# Patient Record
Sex: Male | Born: 1975
Health system: Southern US, Community
[De-identification: ages and names within clinical notes are randomized; demographics above are authoritative.]

## PROBLEM LIST (undated history)

## (undated) DIAGNOSIS — E119 Type 2 diabetes mellitus without complications: Secondary | ICD-10-CM

## (undated) DIAGNOSIS — F1911 Other psychoactive substance abuse, in remission: Secondary | ICD-10-CM

## (undated) DIAGNOSIS — F1011 Alcohol abuse, in remission: Secondary | ICD-10-CM

## (undated) DIAGNOSIS — I10 Essential (primary) hypertension: Secondary | ICD-10-CM

## (undated) DIAGNOSIS — E785 Hyperlipidemia, unspecified: Secondary | ICD-10-CM

## (undated) DIAGNOSIS — G9340 Encephalopathy, unspecified: Secondary | ICD-10-CM

## (undated) DIAGNOSIS — F411 Generalized anxiety disorder: Secondary | ICD-10-CM

## (undated) DIAGNOSIS — F410 Panic disorder [episodic paroxysmal anxiety] without agoraphobia: Secondary | ICD-10-CM

## (undated) HISTORY — DX: Essential (primary) hypertension: I10

## (undated) HISTORY — PX: ANKLE SURGERY: SHX546

## (undated) HISTORY — DX: Type 2 diabetes mellitus without complications: E11.9

## (undated) HISTORY — DX: Generalized anxiety disorder: F41.1

## (undated) HISTORY — DX: Hyperlipidemia, unspecified: E78.5

## (undated) HISTORY — DX: Alcohol abuse, in remission: F10.11

## (undated) HISTORY — DX: Encephalopathy, unspecified: G93.40

## (undated) HISTORY — PX: SHOULDER ARTHROSCOPY: SHX128

## (undated) HISTORY — DX: Other psychoactive substance abuse, in remission: F19.11

## (undated) HISTORY — DX: Panic disorder (episodic paroxysmal anxiety): F41.0

## (undated) HISTORY — PX: ARTHROSCOPIC REPAIR ACL: SUR80

## (undated) HISTORY — PX: ROTATOR CUFF REPAIR: SHX139

---

## 2008-10-13 ENCOUNTER — Emergency Department (HOSPITAL_BASED_OUTPATIENT_CLINIC_OR_DEPARTMENT_OTHER): Admission: EM | Admit: 2008-10-13 | Discharge: 2008-10-13 | Payer: Self-pay | Admitting: Emergency Medicine

## 2009-09-12 ENCOUNTER — Emergency Department (HOSPITAL_COMMUNITY): Admission: EM | Admit: 2009-09-12 | Discharge: 2009-09-12 | Payer: Self-pay | Admitting: Family Medicine

## 2009-09-13 ENCOUNTER — Emergency Department (HOSPITAL_COMMUNITY): Admission: EM | Admit: 2009-09-13 | Discharge: 2009-09-13 | Payer: Self-pay | Admitting: Emergency Medicine

## 2009-09-17 ENCOUNTER — Emergency Department (HOSPITAL_COMMUNITY): Admission: EM | Admit: 2009-09-17 | Discharge: 2009-09-17 | Payer: Self-pay | Admitting: Emergency Medicine

## 2010-04-27 ENCOUNTER — Emergency Department (HOSPITAL_COMMUNITY)
Admission: EM | Admit: 2010-04-27 | Discharge: 2010-04-27 | Payer: Self-pay | Source: Home / Self Care | Admitting: Emergency Medicine

## 2010-04-28 ENCOUNTER — Ambulatory Visit (HOSPITAL_COMMUNITY)
Admission: RE | Admit: 2010-04-28 | Discharge: 2010-04-28 | Payer: Self-pay | Source: Home / Self Care | Attending: Emergency Medicine | Admitting: Emergency Medicine

## 2010-05-30 ENCOUNTER — Encounter: Payer: Self-pay | Admitting: Emergency Medicine

## 2010-07-27 LAB — COMPREHENSIVE METABOLIC PANEL
ALT: 55 U/L — ABNORMAL HIGH (ref 0–53)
Albumin: 3.9 g/dL (ref 3.5–5.2)
Alkaline Phosphatase: 94 U/L (ref 39–117)
Alkaline Phosphatase: 99 U/L (ref 39–117)
BUN: 11 mg/dL (ref 6–23)
CO2: 28 mEq/L (ref 19–32)
Calcium: 8.4 mg/dL (ref 8.4–10.5)
GFR calc non Af Amer: >60 mL/min (ref >60)
Glucose, Bld: 171 mg/dL — ABNORMAL HIGH (ref 70–99)
Potassium: 3 mEq/L — ABNORMAL LOW (ref 3.5–5.1)
Potassium: 3.6 mEq/L (ref 3.5–5.1)
Sodium: 133 mEq/L — ABNORMAL LOW (ref 135–145)
Total Protein: 7.4 g/dL (ref 6.0–8.3)

## 2010-07-27 LAB — ETHANOL
Alcohol, Ethyl (B): 249 mg/dL — ABNORMAL HIGH (ref 0–10)
Alcohol, Ethyl (B): 332 mg/dL — ABNORMAL HIGH (ref 0–10)

## 2010-07-27 LAB — DIFFERENTIAL
Basophils Relative: 1 % (ref 0–1)
Basophils Relative: 1 % (ref 0–1)
Eosinophils Absolute: 0.2 10*3/uL (ref 0.0–0.7)
Lymphocytes Relative: 31 % (ref 12–46)
Lymphs Abs: 2.2 10*3/uL (ref 0.7–4.0)
Monocytes Absolute: 0.5 10*3/uL (ref 0.1–1.0)
Monocytes Relative: 6 % (ref 3–12)
Monocytes Relative: 9 % (ref 3–12)
Neutro Abs: 4.4 10*3/uL (ref 1.7–7.7)
Neutrophils Relative %: 46 % (ref 43–77)

## 2010-07-27 LAB — POCT I-STAT, CHEM 8
Calcium, Ion: 0.98 mmol/L — ABNORMAL LOW (ref 1.12–1.32)
Glucose, Bld: 126 mg/dL — ABNORMAL HIGH (ref 70–99)
Hemoglobin: 18 g/dL — ABNORMAL HIGH (ref 13.0–17.0)
Potassium: 4 mEq/L (ref 3.5–5.1)
TCO2: 26 mmol/L (ref 0–100)

## 2010-07-27 LAB — RAPID URINE DRUG SCREEN, HOSP PERFORMED
Amphetamines: NOT DETECTED
Barbiturates: NOT DETECTED
Benzodiazepines: NOT DETECTED
Opiates: NOT DETECTED

## 2010-07-27 LAB — URINALYSIS, ROUTINE W REFLEX MICROSCOPIC
Glucose, UA: NEGATIVE mg/dL
Hgb urine dipstick: NEGATIVE
Specific Gravity, Urine: 1.017 (ref 1.005–1.030)
pH: 6 (ref 5.0–8.0)

## 2010-07-27 LAB — CBC
HCT: 48.2 % (ref 39.0–52.0)
MCHC: 35.5 g/dL (ref 30.0–36.0)
MCHC: 35.6 g/dL (ref 30.0–36.0)
Platelets: 261 10*3/uL (ref 150–400)
Platelets: 291 10*3/uL (ref 150–400)
RDW: 13.9 % (ref 11.5–15.5)

## 2010-07-27 LAB — LIPASE, BLOOD: Lipase: 22 U/L (ref 11–59)

## 2010-08-16 LAB — URINALYSIS, ROUTINE W REFLEX MICROSCOPIC
Bilirubin Urine: NEGATIVE
Glucose, UA: NEGATIVE mg/dL
Hgb urine dipstick: NEGATIVE
Ketones, ur: NEGATIVE mg/dL
Nitrite: NEGATIVE
Protein, ur: NEGATIVE mg/dL
Specific Gravity, Urine: 1.008 (ref 1.005–1.030)
Urobilinogen, UA: 1 mg/dL (ref 0.0–1.0)
pH: 7 (ref 5.0–8.0)

## 2010-08-16 LAB — CBC
Hemoglobin: 16.4 g/dL (ref 13.0–17.0)
MCHC: 34.6 g/dL (ref 30.0–36.0)
MCV: 93.9 fL (ref 78.0–100.0)
RDW: 13.4 % (ref 11.5–15.5)

## 2010-08-16 LAB — POCT TOXICOLOGY PANEL: Tetrahydrocannabinol: POSITIVE

## 2010-08-16 LAB — DIFFERENTIAL
Basophils Absolute: 0.1 10*3/uL (ref 0.0–0.1)
Basophils Relative: 2 % — ABNORMAL HIGH (ref 0–1)
Eosinophils Absolute: 0.2 10*3/uL (ref 0.0–0.7)
Eosinophils Relative: 3 % (ref 0–5)
Lymphocytes Relative: 31 % (ref 12–46)
Lymphs Abs: 1.7 10*3/uL (ref 0.7–4.0)
Monocytes Absolute: 0.4 10*3/uL (ref 0.1–1.0)
Monocytes Relative: 7 % (ref 3–12)
Neutro Abs: 3.1 10*3/uL (ref 1.7–7.7)
Neutrophils Relative %: 58 % (ref 43–77)

## 2010-08-16 LAB — BASIC METABOLIC PANEL
CO2: 31 mEq/L (ref 19–32)
Calcium: 8.9 mg/dL (ref 8.4–10.5)
Chloride: 96 mEq/L (ref 96–112)
Creatinine, Ser: 1 mg/dL (ref 0.4–1.5)
Glucose, Bld: 117 mg/dL — ABNORMAL HIGH (ref 70–99)

## 2010-08-16 LAB — ETHANOL: Alcohol, Ethyl (B): 87 mg/dL — ABNORMAL HIGH (ref 0–10)

## 2011-07-20 ENCOUNTER — Emergency Department (HOSPITAL_COMMUNITY): Payer: BC Managed Care – PPO

## 2011-07-20 ENCOUNTER — Encounter (HOSPITAL_COMMUNITY): Payer: Self-pay | Admitting: Emergency Medicine

## 2011-07-20 ENCOUNTER — Inpatient Hospital Stay (HOSPITAL_COMMUNITY)
Admission: EM | Admit: 2011-07-20 | Discharge: 2011-07-27 | DRG: 584 | Disposition: A | Discharge status: Home / Self Care | Payer: BC Managed Care – PPO | Attending: Internal Medicine | Admitting: Internal Medicine

## 2011-07-20 ENCOUNTER — Other Ambulatory Visit: Payer: Self-pay

## 2011-07-20 DIAGNOSIS — J9600 Acute respiratory failure, unspecified whether with hypoxia or hypercapnia: Secondary | ICD-10-CM

## 2011-07-20 DIAGNOSIS — J690 Pneumonitis due to inhalation of food and vomit: Secondary | ICD-10-CM

## 2011-07-20 DIAGNOSIS — N179 Acute kidney failure, unspecified: Secondary | ICD-10-CM

## 2011-07-20 DIAGNOSIS — T401X1A Poisoning by heroin, accidental (unintentional), initial encounter: Secondary | ICD-10-CM | POA: Diagnosis present

## 2011-07-20 DIAGNOSIS — R918 Other nonspecific abnormal finding of lung field: Secondary | ICD-10-CM

## 2011-07-20 DIAGNOSIS — R4182 Altered mental status, unspecified: Secondary | ICD-10-CM

## 2011-07-20 DIAGNOSIS — E872 Acidosis, unspecified: Secondary | ICD-10-CM | POA: Diagnosis present

## 2011-07-20 DIAGNOSIS — R748 Abnormal levels of other serum enzymes: Secondary | ICD-10-CM

## 2011-07-20 DIAGNOSIS — M6282 Rhabdomyolysis: Secondary | ICD-10-CM | POA: Diagnosis present

## 2011-07-20 DIAGNOSIS — I214 Non-ST elevation (NSTEMI) myocardial infarction: Secondary | ICD-10-CM

## 2011-07-20 DIAGNOSIS — G9340 Encephalopathy, unspecified: Secondary | ICD-10-CM

## 2011-07-20 DIAGNOSIS — F1110 Opioid abuse, uncomplicated: Secondary | ICD-10-CM | POA: Diagnosis present

## 2011-07-20 DIAGNOSIS — R4020 Unspecified coma: Secondary | ICD-10-CM

## 2011-07-20 DIAGNOSIS — E860 Dehydration: Secondary | ICD-10-CM

## 2011-07-20 DIAGNOSIS — N390 Urinary tract infection, site not specified: Secondary | ICD-10-CM

## 2011-07-20 DIAGNOSIS — I10 Essential (primary) hypertension: Secondary | ICD-10-CM | POA: Diagnosis present

## 2011-07-20 DIAGNOSIS — F1910 Other psychoactive substance abuse, uncomplicated: Secondary | ICD-10-CM | POA: Insufficient documentation

## 2011-07-20 DIAGNOSIS — E119 Type 2 diabetes mellitus without complications: Secondary | ICD-10-CM | POA: Diagnosis present

## 2011-07-20 DIAGNOSIS — G9341 Metabolic encephalopathy: Secondary | ICD-10-CM | POA: Diagnosis present

## 2011-07-20 DIAGNOSIS — F17200 Nicotine dependence, unspecified, uncomplicated: Secondary | ICD-10-CM | POA: Diagnosis present

## 2011-07-20 DIAGNOSIS — D72829 Elevated white blood cell count, unspecified: Secondary | ICD-10-CM | POA: Diagnosis present

## 2011-07-20 DIAGNOSIS — A419 Sepsis, unspecified organism: Principal | ICD-10-CM | POA: Diagnosis present

## 2011-07-20 DIAGNOSIS — T40601A Poisoning by unspecified narcotics, accidental (unintentional), initial encounter: Secondary | ICD-10-CM

## 2011-07-20 DIAGNOSIS — E875 Hyperkalemia: Secondary | ICD-10-CM

## 2011-07-20 HISTORY — DX: Encephalopathy, unspecified: G93.40

## 2011-07-20 LAB — TYPE AND SCREEN
ABO/RH(D): O POS
Antibody Screen: NEGATIVE

## 2011-07-20 LAB — POCT I-STAT 3, ART BLOOD GAS (G3+)
Acid-base deficit: 2 mmol/L (ref 0.0–2.0)
Acid-base deficit: 3 mmol/L — ABNORMAL HIGH (ref 0.0–2.0)
Bicarbonate: 23.8 mEq/L (ref 20.0–24.0)
O2 Saturation: 94 %
O2 Saturation: 98 %
O2 Saturation: 95 %
O2 Saturation: 96 %
TCO2: 23 mmol/L (ref 0–100)
pCO2 arterial: 48.8 mmHg — ABNORMAL HIGH (ref 35.0–45.0)
pCO2 arterial: 47.8 mmHg — ABNORMAL HIGH (ref 35.0–45.0)
pCO2 arterial: 48.8 mmHg — ABNORMAL HIGH (ref 35.0–45.0)
pCO2 arterial: 52.7 mmHg — ABNORMAL HIGH (ref 35.0–45.0)
pH, Arterial: 7.244 — ABNORMAL LOW (ref 7.350–7.450)
pH, Arterial: 7.256 — ABNORMAL LOW (ref 7.350–7.450)
pO2, Arterial: 128 mmHg — ABNORMAL HIGH (ref 80.0–100.0)
pO2, Arterial: 81 mmHg (ref 80.0–100.0)
pO2, Arterial: 112 mmHg — ABNORMAL HIGH (ref 80.0–100.0)
pO2, Arterial: 84 mmHg (ref 80.0–100.0)
pO2, Arterial: 100 mmHg (ref 80.0–100.0)

## 2011-07-20 LAB — POCT I-STAT TROPONIN I: Troponin i, poc: 2.49 ng/mL (ref 0.00–0.08)

## 2011-07-20 LAB — POCT I-STAT, CHEM 8
HCT: 62 % — ABNORMAL HIGH (ref 39.0–52.0)
Hemoglobin: 21.1 g/dL (ref 13.0–17.0)
Potassium: 6.8 mEq/L (ref 3.5–5.1)
Sodium: 140 mEq/L (ref 135–145)

## 2011-07-20 LAB — COMPREHENSIVE METABOLIC PANEL
BUN: 17 mg/dL (ref 6–23)
CO2: 23 mEq/L (ref 19–32)
Calcium: 7.1 mg/dL — ABNORMAL LOW (ref 8.4–10.5)
Creatinine, Ser: 3.02 mg/dL — ABNORMAL HIGH (ref 0.50–1.35)
GFR calc Af Amer: 29 mL/min — ABNORMAL LOW (ref >90)
GFR calc non Af Amer: 25 mL/min — ABNORMAL LOW (ref >90)
Glucose, Bld: 130 mg/dL — ABNORMAL HIGH (ref 70–99)
Sodium: 139 mEq/L (ref 135–145)
Total Protein: 6.6 g/dL (ref 6.0–8.3)

## 2011-07-20 LAB — CBC
Platelets: 286 10*3/uL (ref 150–400)
RBC: 5.9 MIL/uL — ABNORMAL HIGH (ref 4.22–5.81)
RDW: 12.2 % (ref 11.5–15.5)
WBC: 32.1 10*3/uL — ABNORMAL HIGH (ref 4.0–10.5)

## 2011-07-20 LAB — URINE MICROSCOPIC-ADD ON

## 2011-07-20 LAB — DIFFERENTIAL
Blasts: 0 %
Eosinophils Absolute: 0 10*3/uL (ref 0.0–0.7)
Eosinophils Relative: 0 % (ref 0–5)
Lymphocytes Relative: 4 % — ABNORMAL LOW (ref 12–46)
Lymphs Abs: 1.3 10*3/uL (ref 0.7–4.0)
Metamyelocytes Relative: 0 %
Monocytes Absolute: 0.6 10*3/uL (ref 0.1–1.0)
Monocytes Relative: 2 % — ABNORMAL LOW (ref 3–12)
Neutro Abs: 30.2 10*3/uL — ABNORMAL HIGH (ref 1.7–7.7)
Neutrophils Relative %: 94 % — ABNORMAL HIGH (ref 43–77)
nRBC: 0 /100 WBC

## 2011-07-20 LAB — CARDIAC PANEL(CRET KIN+CKTOT+MB+TROPI)
Relative Index: 0.3 (ref 0.0–2.5)
Relative Index: 0.2 (ref 0.0–2.5)
Total CK: 20529 U/L — ABNORMAL HIGH (ref 7–232)

## 2011-07-20 LAB — URINALYSIS, ROUTINE W REFLEX MICROSCOPIC
Bilirubin Urine: NEGATIVE
Ketones, ur: NEGATIVE mg/dL
Nitrite: NEGATIVE
Protein, ur: 30 mg/dL — AB
Urobilinogen, UA: 0.2 mg/dL (ref 0.0–1.0)

## 2011-07-20 LAB — GLUCOSE, CAPILLARY: Glucose-Capillary: 162 mg/dL — ABNORMAL HIGH (ref 70–99)

## 2011-07-20 LAB — RAPID URINE DRUG SCREEN, HOSP PERFORMED
Amphetamines: NOT DETECTED
Barbiturates: NOT DETECTED
Barbiturates: NOT DETECTED
Tetrahydrocannabinol: NOT DETECTED
Tetrahydrocannabinol: NOT DETECTED

## 2011-07-20 LAB — ABO/RH: ABO/RH(D): O POS

## 2011-07-20 LAB — APTT: aPTT: 30 seconds (ref 24–37)

## 2011-07-20 LAB — BASIC METABOLIC PANEL
Calcium: 7.3 mg/dL — ABNORMAL LOW (ref 8.4–10.5)
GFR calc Af Amer: 35 mL/min — ABNORMAL LOW (ref >90)
GFR calc non Af Amer: 30 mL/min — ABNORMAL LOW (ref >90)
Potassium: 5.1 mEq/L (ref 3.5–5.1)
Sodium: 140 mEq/L (ref 135–145)

## 2011-07-20 LAB — CORTISOL: Cortisol, Plasma: 41.3 ug/dL

## 2011-07-20 LAB — STREP PNEUMONIAE URINARY ANTIGEN: Strep Pneumo Urinary Antigen: NEGATIVE

## 2011-07-20 LAB — PROCALCITONIN: Procalcitonin: 10.56 ng/mL

## 2011-07-20 LAB — LACTIC ACID, PLASMA: Lactic Acid, Venous: 2.6 mmol/L — ABNORMAL HIGH (ref 0.5–2.2)

## 2011-07-20 MED ORDER — PANTOPRAZOLE SODIUM 40 MG IV SOLR
40.0000 mg | Freq: Every day | INTRAVENOUS | Status: DC
Start: 2011-07-20 — End: 2011-07-22
  Administered 2011-07-20 – 2011-07-21 (×2): 40 mg via INTRAVENOUS
  Filled 2011-07-20 (×4): qty 40

## 2011-07-20 MED ORDER — PIPERACILLIN-TAZOBACTAM IN DEX 2-0.25 GM/50ML IV SOLN
2.2500 g | Freq: Four times a day (QID) | INTRAVENOUS | Status: DC
  Administered 2011-07-20 – 2011-07-21 (×2): 2.25 g via INTRAVENOUS
  Filled 2011-07-20 (×5): qty 50

## 2011-07-20 MED ORDER — CHLORHEXIDINE GLUCONATE 0.12 % MT SOLN
15.0000 mL | Freq: Two times a day (BID) | OROMUCOSAL | Status: DC
  Administered 2011-07-20 – 2011-07-25 (×10): 15 mL via OROMUCOSAL
  Filled 2011-07-20 (×11): qty 15

## 2011-07-20 MED ORDER — FENTANYL CITRATE 0.05 MG/ML IJ SOLN: 2.0000 ug/kg | Freq: Once | INTRAMUSCULAR | Status: DC

## 2011-07-20 MED ORDER — SODIUM CHLORIDE 0.9 % IV SOLN
2.0000 mg/h | INTRAVENOUS | Status: DC
  Administered 2011-07-21: 3 mg/h via INTRAVENOUS
  Filled 2011-07-20: qty 20

## 2011-07-20 MED ORDER — SODIUM POLYSTYRENE SULFONATE 15 GM/60ML PO SUSP
60.0000 g | Freq: Once | ORAL | Status: AC
  Administered 2011-07-20: 60 g
  Filled 2011-07-20 (×2): qty 240

## 2011-07-20 MED ORDER — SODIUM CHLORIDE 0.9 % IV SOLN
2.0000 mg/h | INTRAVENOUS | Status: DC
  Administered 2011-07-20: 2 mg/h via INTRAVENOUS
  Filled 2011-07-20 (×3): qty 10

## 2011-07-20 MED ORDER — VANCOMYCIN HCL 1000 MG IV SOLR: 1500.0000 mg | INTRAVENOUS | Status: DC

## 2011-07-20 MED ORDER — CALCIUM GLUCONATE 10 % IV SOLN
INTRAVENOUS | Status: AC
  Filled 2011-07-20: qty 10

## 2011-07-20 MED ORDER — SODIUM CHLORIDE 0.9 % IV SOLN: INTRAVENOUS | Status: DC

## 2011-07-20 MED ORDER — SODIUM CHLORIDE 0.9 % IV SOLN
50.0000 ug/h | INTRAVENOUS | Status: DC
  Administered 2011-07-20 – 2011-07-21 (×3): 100 ug/h via INTRAVENOUS
  Filled 2011-07-20 (×2): qty 50

## 2011-07-20 MED ORDER — SODIUM CHLORIDE 0.9 % IV SOLN
INTRAVENOUS | Status: AC
  Filled 2011-07-20: qty 100

## 2011-07-20 MED ORDER — SODIUM CHLORIDE 0.9 % IV SOLN: 250.0000 mL | INTRAVENOUS | Status: DC | PRN

## 2011-07-20 MED ORDER — INSULIN ASPART 100 UNIT/ML ~~LOC~~ SOLN
SUBCUTANEOUS | Status: AC
  Filled 2011-07-20: qty 10

## 2011-07-20 MED ORDER — DEXTROSE 50 % IV SOLN
50.0000 mL | Freq: Once | INTRAVENOUS | Status: AC
  Administered 2011-07-20: 50 mL via INTRAVENOUS
  Filled 2011-07-20: qty 50

## 2011-07-20 MED ORDER — VANCOMYCIN HCL 1000 MG IV SOLR
1500.0000 mg | INTRAVENOUS | Status: AC
  Administered 2011-07-20: 1500 mg via INTRAVENOUS
  Filled 2011-07-20: qty 1500

## 2011-07-20 MED ORDER — SODIUM BICARBONATE 8.4 % IV SOLN
INTRAVENOUS | Status: DC
  Administered 2011-07-20 – 2011-07-21 (×2): via INTRAVENOUS
  Filled 2011-07-20 (×4): qty 150

## 2011-07-20 MED ORDER — SODIUM CHLORIDE 0.9 % IV SOLN
1.0000 g | Freq: Once | INTRAVENOUS | Status: AC
  Administered 2011-07-20: 1 g via INTRAVENOUS

## 2011-07-20 MED ORDER — SODIUM CHLORIDE 0.9 % IV SOLN
10.0000 ug/h | INTRAVENOUS | Status: DC
  Administered 2011-07-20: 10 ug/h via INTRAVENOUS
  Filled 2011-07-20: qty 50

## 2011-07-20 MED ORDER — INSULIN REGULAR HUMAN 100 UNIT/ML IJ SOLN
10.0000 [IU] | Freq: Once | INTRAMUSCULAR | Status: AC
  Administered 2011-07-20: 10 [IU] via INTRAVENOUS

## 2011-07-20 MED ORDER — SODIUM BICARBONATE 8.4 % IV SOLN
50.0000 meq | Freq: Once | INTRAVENOUS | Status: AC
  Administered 2011-07-20: 50 meq via INTRAVENOUS
  Filled 2011-07-20: qty 50

## 2011-07-20 MED ORDER — BIOTENE DRY MOUTH MT LIQD
15.0000 mL | Freq: Four times a day (QID) | OROMUCOSAL | Status: DC
  Administered 2011-07-21 – 2011-07-26 (×18): 15 mL via OROMUCOSAL

## 2011-07-20 MED ORDER — SODIUM CHLORIDE 0.9 % IV SOLN
INTRAVENOUS | Status: DC
  Administered 2011-07-20: 22:00:00 via INTRAVENOUS

## 2011-07-20 MED ORDER — PIPERACILLIN-TAZOBACTAM IN DEX 2-0.25 GM/50ML IV SOLN
2.2500 g | INTRAVENOUS | Status: AC
  Administered 2011-07-20: 2.25 g via INTRAVENOUS
  Filled 2011-07-20 (×2): qty 50

## 2011-07-20 NOTE — ED Notes (Signed)
Patient returned from CT

## 2011-07-20 NOTE — H&P (Addendum)
Name: Ronald Greer MRN: [id number] DOB: 02/26/1976    LOS: 0  [facility]  History of Present Illness:  36 year old white male, with reported h/o heroine use,  presented to the ER on 3/13 after being found by friend unresponsive.  On EMS arrival he was obtunded, he began to cough and gag after narcan administration but did not wake up. He was intubated by EDP for airway protection, PCCM asked to admit.   Lines / Drains: OEtt 3/13>>>  Cultures: Sputum 3/13>>> BCX2 3/13>>>  Antibiotics: Zosyn 3/13 (UTI and aspiration)>>> vanc 3/13 (h/o IV drug abuse)>>>  Tests / Events: CT head 3/13>>> UDS 3/13:opioids+ The patient is sedated, intubated and unable to provide history, which was obtained for available medical records.    Past Medical History  Diagnosis Date  . Intravenous drug abuse, continuous    History reviewed. No pertinent past surgical history. Prior to Admission medications   Not on File   Allergies Not on File  Family History No family history on file.  Social History  reports that he has been smoking.  He does not have any smokeless tobacco history on file. He reports that he drinks alcohol. He reports that he uses illicit drugs (IV).  Review Of Systems  11 points review of systems is negative with an exception of listed in HPI.  Vital Signs: BP 114/55  Pulse 111  Temp(Src) 99.7 F (37.6 C) (Core (Comment))  Resp 24  Ht 5\' 11"  (1.803 m)  SpO2 93%       . fentaNYL infusion INTRAVENOUS 10 mcg/hr (07/20/11 1212)  . midazolam (VERSED) infusion 2 mg/hr (07/20/11 1213)      Physical Examination: General:  Well nourished white male, currently unresponsive on vent  Neuro:  GCS 5-6, + cough and gag HEENT: PERRL, orally intubated Cardiovascular:  rrr Lungs:  Diffuse rhonchi Abdomen:  Soft, + bowel sounds decreased Musculoskeletal:  intact Skin: intact  Ventilator settings: Vent Mode:  [-] PRVC FiO2 (%):  [60 %-100 %] 60 % Set  Rate:  [20 bmp-24 bmp] 24 bmp Vt Set:  [550 mL-650 mL] 640 mL PEEP:  [5 cmH20-10 cmH20] 10 cmH20 Plateau Pressure:  [12 cmH20] 12 cmH20  Labs and Imaging:   Lab 07/20/11 1134  NA 140  K 6.8*  CL 107  CO2 --  BUN 19  CREATININE 3.90*  GLUCOSE 109*    Lab 07/20/11 1150 07/20/11 1134  HGB 18.7* 21.1*  HCT 55.3* 62.0*  WBC 32.1* --  PLT 286 --  PCXR: bilateral infiltrates, right base and left  Assessment and Plan:  Acute encephalopathy/  Coma: opioid +,  has injection marks on arm and h/o heroin is possibility. CT head negative.  Plan: -supportive care -urine opoid screen -repeat CT head if no improvement   Acute respiratory failure in setting of Altered Mental status, complicated by bilateral evolving Pulmonary infiltrates, presumed Aspiration pneumonia: high risk for ALI/ARDS Plan -full vent support -f/u abg and pcxr -nebs -sedation protocol  Sepsis: in setting of presumed aspiration PNA, also has evidence of  UTI (lower urinary tract infection)   Lab 07/20/11 1335 07/20/11 1150  PROCALCITON -- --  WBC 25.7* 32.1*  LATICACIDVEN -- --  Plan -IVFs -empiric abx, cover for aspiration, UTI and also coverage for IV drug abuse -check lactate, if increased-->EGDT protocol  Dehydration Plan: -ivfs  Acute renal failure with hyperkalemia   Lab 07/20/11 1134  CREATININE 3.90*  CO2 --  K 6.8*   Plan: -ivfs -  urine myoglobin -rx hyperkalemia (Insulin, bicarb, d50, kayexalate) -f/u chemistry -close I&O -recheck ABG   Best practices / Disposition: -->ICU status under PCCM -->full code -->SCD for DVT Px, until head cleared -->Protonix for GI Px -->ventilator bundle -->diet: NPO  The patient is critically ill with multiple organ systems failure and requires high complexity decision making for assessment and support, frequent evaluation and titration of therapies, application of advanced monitoring technologies and extensive interpretation of multiple  databases. Critical Care Time devoted to patient care services described in this note is 60 minutes.  BABCOCK,PETE 07/20/2011, 1:53 PM  Patient with narcotic overdose with a subsequent aspiration PNA, meeting SIRS criteria.  Critically ill.  CC time 90 minutes.  Patient seen and examined, agree with above note.  I dictated the care and orders written for this patient under my direction.  Koren Bound, M.D. [phone]

## 2011-07-20 NOTE — Progress Notes (Signed)
ANTIBIOTIC CONSULT NOTE - INITIAL  Pharmacy Consult for vancomycin and zosyn Indication: rule out pneumonia  Not on File  Patient Measurements: Height: 5\' 11"  (180.3 cm) IBW/kg (Calculated) : 75.3  Adjusted Body Weight:   Vital Signs: Temp: 99.7 F (37.6 C) (03/13 1415) Temp src: Core (Comment) (03/13 1315) BP: 140/72 mmHg (03/13 1415) Pulse Rate: 108  (03/13 1415) Intake/Output from previous day:   Intake/Output from this shift:    Labs:  Basename 07/20/11 1335 07/20/11 1150 07/20/11 1134  WBC 25.7* 32.1* --  HGB 17.4* 18.7* 21.1*  PLT 189 286 --  LABCREA -- -- --  CREATININE -- -- 3.90*   The CrCl is unknown because both a height and weight (above a minimum accepted value) are required for this calculation. No results found for this basename: VANCOTROUGH:2,VANCOPEAK:2,VANCORANDOM:2,GENTTROUGH:2,GENTPEAK:2,GENTRANDOM:2,TOBRATROUGH:2,TOBRAPEAK:2,TOBRARND:2,AMIKACINPEAK:2,AMIKACINTROU:2,AMIKACIN:2, in the last 72 hours   Microbiology: No results found for this or any previous visit (from the past 720 hour(s)).  Medical History: Past Medical History  Diagnosis Date  . Intravenous drug abuse, continuous     Medications:  Scheduled:    . fentaNYL  2 mcg/kg Intravenous Once  . pantoprazole (PROTONIX) IV  40 mg Intravenous QHS   Infusions:    . sodium chloride    . fentaNYL infusion INTRAVENOUS 10 mcg/hr (07/20/11 1212)  . midazolam (VERSED) infusion 2 mg/hr (07/20/11 1213)   Assessment: 36 yo male with suspected PNA will be started on vancomycin and zosyn.  No weight yet but estimated around 220 lbs per RN. SCr 3.9 (CrCl probably <30)  Goal of Therapy:  Vancomycin trough level 15-20 mcg/ml  Plan:  1) Vancomycin 1500mg  iv q48h 2) Zosyn 2.25g iv q6h. 3) Monitor renal fxn closely and check vancomycin trough when it's appropriate.  Tyric Rodeheaver, Tsz-Yin 07/20/2011,2:23 PM

## 2011-07-20 NOTE — ED Notes (Signed)
2102-01 Ready 

## 2011-07-20 NOTE — Procedures (Signed)
Central Venous Catheter Insertion Procedure Note Ronald Greer [id number] Dec 06, 1975  Procedure: Insertion of Central Venous Catheter/ ultra sound guided.  Indications: Assessment of intravascular volume, Drug and/or fluid administration and Frequent blood sampling  Procedure Details Consent: Unable to obtain consent because of emergent medical necessity. Time Out: Verified patient identification, verified procedure, site/side was marked, verified correct patient position, special equipment/implants available, medications/allergies/relevent history reviewed, required imaging and test results available.  Performed  Maximum sterile technique was used including antiseptics, cap, gloves, gown, hand hygiene, mask and sheet. Skin prep: Chlorhexidine; local anesthetic administered A antimicrobial bonded/coated triple lumen catheter was placed in the left internal jugular vein using the Seldinger technique.  Evaluation Blood flow good Complications: No apparent complications Patient did tolerate procedure well. Chest X-ray ordered to verify placement.  CXR: pending.  U/S used in placement.  Ronald Greer 07/20/2011, 2:59 PM

## 2011-07-20 NOTE — ED Notes (Signed)
#   18 oral gastric tube w/ return of thick brown fluid  Low cont for right now

## 2011-07-20 NOTE — ED Provider Notes (Signed)
History     CSN: [id number]  Arrival date & time 07/20/11  1143   None     Chief Complaint  Patient presents with  . Drug Overdose    (Consider location/radiation/quality/duration/timing/severity/associated sxs/prior treatment) HPI Comments: Patient is a 36 year old man who was brought in by EMS. He was unresponsive at friends house. The paramedics found him sitting in a chair unresponsive. He has a history of heroin abuse according to paramedics, and they noted 3 puncture wound on the right biceps area for which they thought might be injection sites. The patient had an IV placed, and was given 4 mg of naloxone, and seemed to responded has tried dilating is Pitocin hyperventilating. The placed a nasal trumpet in his nose which caused some nose bleed. He was then brought to the [facility] [place] for evaluation. The patient is unresponsive, and is unable to give any history or review of systems.  Patient is a 36 y.o. male presenting with Overdose and altered mental status.  Drug Overdose  Altered Mental Status    No past medical history on file.  No past surgical history on file.  No family history on file.  History  Substance Use Topics  . Smoking status: Not on file  . Smokeless tobacco: Not on file  . Alcohol Use: Not on file      Review of Systems  Unable to perform ROS: Mental status change  Psychiatric/Behavioral: Positive for altered mental status.    Allergies  Review of patient's allergies indicates not on file.  Home Medications  No current outpatient prescriptions on file.  BP 144/109  Pulse 129  Resp 38  SpO2 99%  Physical Exam  Constitutional:       Patient is an obese middle-aged man, who is hyperventilating, and is unresponsive to all verbal or painful stimuli.  HENT:  Head: Normocephalic and atraumatic.  Right Ear: External ear normal.  Left Ear: External ear normal.  Eyes:       Both pupils are widely dilated and unresponsive to light.  Neck:  Normal range of motion. Neck supple.  Cardiovascular:       Sinus tachycardia, no murmur.  Pulmonary/Chest: Effort normal and breath sounds normal.       Lungs clear to auscultation.  Abdominal: Soft. Bowel sounds are normal. He exhibits no distension. There is no tenderness.  Musculoskeletal:       He has 3 puncture wounds on the right biceps, which could be needle injection sites.  Neurological:       He is unresponsive to verbal or painful stimuli. He does move his arms and legs the lobe that when he changes position.  Skin: Skin is warm and dry.  Psychiatric:       Unable to assess.    ED Course  INTUBATION Date/Time: 07/20/2011 12:05 PM Performed by: Osvaldo Human Authorized by: Osvaldo Human Consent: Verbal consent not obtained. Written consent not obtained. The procedure was performed in an emergent situation. Patient identity confirmed: arm band Time out: Immediately prior to procedure a "time out" was called to verify the correct patient, procedure, equipment, support staff and site/side marked as required. Indications: airway protection Intubation method: direct Patient status: paralyzed (RSI) Preoxygenation: BVM Pretreatment medications: lidocaine Sedatives: etomidate Paralytic: succinylcholine Laryngoscope size: Miller 4 Tube size: 8.0 mm Tube type: cuffed Number of attempts: 1 Cricoid pressure: no Cords visualized: yes Post-procedure assessment: chest rise and ETCO2 monitor Breath sounds: equal Cuff inflated: yes ETT to lip:  24 cm Tube secured with: ETT holder Chest x-ray interpreted by me. Chest x-ray findings: endotracheal tube too high Tube repositioned: tube repositioned successfully Patient tolerance: Patient tolerated the procedure well with no immediate complications.  CRITICAL CARE Performed by: Osvaldo Human Authorized by: Osvaldo Human Total critical care time: 60 minutes Critical care was necessary to treat or prevent  imminent or life-threatening deterioration of the following conditions: toxidrome, CNS failure or compromise and respiratory failure. Critical care was time spent personally by me on the following activities: development of treatment plan with patient or surrogate, discussions with consultants, evaluation of patient's response to treatment, examination of patient, obtaining history from patient or surrogate, ordering and performing treatments and interventions, ordering and review of radiographic studies, ordering and review of laboratory studies, re-evaluation of patient's condition and ventilator management.   (including critical care time)   Labs Reviewed  CBC  DIFFERENTIAL  COMPREHENSIVE METABOLIC PANEL  URINALYSIS, ROUTINE W REFLEX MICROSCOPIC  CK  D-DIMER, QUANTITATIVE  URINE RAPID DRUG SCREEN (HOSP PERFORMED)  ETHANOL  ACETAMINOPHEN LEVEL  SALICYLATE LEVEL  TRICYCLICS SCREEN, URINE    Date: 07/20/2011  Rate: 129  Rhythm: sinus tachycardia  QRS Axis: right  Intervals: normal  ST/T Wave abnormalities: normal  Conduction Disutrbances:none  Narrative Interpretation: Sinus tachycardia.  Old EKG Reviewed: none available  Results for orders placed during the hospital encounter of 07/20/11  CBC      Component Value Range   WBC 32.1 (*) 4.0 - 10.5 (K/uL)   RBC 5.90 (*) 4.22 - 5.81 (MIL/uL)   Hemoglobin 18.7 (*) 13.0 - 17.0 (g/dL)   HCT 96.0 (*) 45.4 - 52.0 (%)   MCV 93.7  78.0 - 100.0 (fL)   MCH 31.7  26.0 - 34.0 (pg)   MCHC 33.8  30.0 - 36.0 (g/dL)   RDW 09.8  11.9 - 14.7 (%)   Platelets 286  150 - 400 (K/uL)  DIFFERENTIAL      Component Value Range   Neutrophils Relative 94 (*) 43 - 77 (%)   Lymphocytes Relative 4 (*) 12 - 46 (%)   Monocytes Relative 2 (*) 3 - 12 (%)   Eosinophils Relative 0  0 - 5 (%)   Basophils Relative 0  0 - 1 (%)   Band Neutrophils 0  0 - 10 (%)   Metamyelocytes Relative 0     Myelocytes 0     Promyelocytes Absolute 0     Blasts 0     nRBC 0   0 (/100 WBC)   Neutro Abs 30.2 (*) 1.7 - 7.7 (K/uL)   Lymphs Abs 1.3  0.7 - 4.0 (K/uL)   Monocytes Absolute 0.6  0.1 - 1.0 (K/uL)   Eosinophils Absolute 0.0  0.0 - 0.7 (K/uL)   Basophils Absolute 0.0  0.0 - 0.1 (K/uL)   WBC Morphology INCREASED BANDS (>20% BANDS)     Smear Review LARGE PLATELETS PRESENT    URINALYSIS, ROUTINE W REFLEX MICROSCOPIC      Component Value Range   Color, Urine YELLOW  YELLOW    APPearance CLOUDY (*) CLEAR    Specific Gravity, Urine 1.014  1.005 - 1.030    pH 5.0  5.0 - 8.0    Glucose, UA 100 (*) NEGATIVE (mg/dL)   Hgb urine dipstick LARGE (*) NEGATIVE    Bilirubin Urine NEGATIVE  NEGATIVE    Ketones, ur NEGATIVE  NEGATIVE (mg/dL)   Protein, ur 30 (*) NEGATIVE (mg/dL)   Urobilinogen, UA 0.2  0.0 - 1.0 (mg/dL)   Nitrite NEGATIVE  NEGATIVE    Leukocytes, UA NEGATIVE  NEGATIVE   D-DIMER, QUANTITATIVE      Component Value Range   D-Dimer, Quant 3.07 (*) 0.00 - 0.48 (ug/mL-FEU)  URINE RAPID DRUG SCREEN (HOSP PERFORMED)      Component Value Range   Opiates POSITIVE (*) NONE DETECTED    Cocaine NONE DETECTED  NONE DETECTED    Benzodiazepines NONE DETECTED  NONE DETECTED    Amphetamines NONE DETECTED  NONE DETECTED    Tetrahydrocannabinol NONE DETECTED  NONE DETECTED    Barbiturates NONE DETECTED  NONE DETECTED   TRICYCLICS SCREEN, URINE      Component Value Range   TCA Scrn NONE DETECTED  NONE DETECTED   POCT I-STAT, CHEM 8      Component Value Range   Sodium 140  135 - 145 (mEq/L)   Potassium 6.8 (*) 3.5 - 5.1 (mEq/L)   Chloride 107  96 - 112 (mEq/L)   BUN 19  6 - 23 (mg/dL)   Creatinine, Ser 1.61 (*) 0.50 - 1.35 (mg/dL)   Glucose, Bld 096 (*) 70 - 99 (mg/dL)   Calcium, Ion 0.45 (*) 1.12 - 1.32 (mmol/L)   TCO2 23  0 - 100 (mmol/L)   Hemoglobin 21.1 (*) 13.0 - 17.0 (g/dL)   HCT 40.9 (*) 81.1 - 52.0 (%)   Comment NOTIFIED PHYSICIAN    POCT I-STAT TROPONIN I      Component Value Range   Troponin i, poc 2.49 (*) 0.00 - 0.08 (ng/mL)   Comment  NOTIFIED PHYSICIAN     Comment 3           URINE MICROSCOPIC-ADD ON      Component Value Range   Squamous Epithelial / LPF RARE  RARE    WBC, UA 0-2  <3 (WBC/hpf)   RBC / HPF 11-20  <3 (RBC/hpf)   Bacteria, UA FEW (*) RARE    Casts HYALINE CASTS (*) NEGATIVE    Urine-Other AMORPHOUS URATES/PHOSPHATES    POCT I-STAT 3, BLOOD GAS (G3+)      Component Value Range   pH, Arterial 7.244 (*) 7.350 - 7.450    pCO2 arterial 55.0 (*) 35.0 - 45.0 (mmHg)   pO2, Arterial 128.0 (*) 80.0 - 100.0 (mmHg)   Bicarbonate 23.8  20.0 - 24.0 (mEq/L)   TCO2 25  0 - 100 (mmol/L)   O2 Saturation 98.0     Acid-base deficit 5.0 (*) 0.0 - 2.0 (mmol/L)   Collection site RADIAL, ALLEN'S TEST ACCEPTABLE     Drawn by Operator     Sample type ARTERIAL    LACTIC ACID, PLASMA      Component Value Range   Lactic Acid, Venous 2.6 (*) 0.5 - 2.2 (mmol/L)  CARDIAC PANEL(CRET KIN+CKTOT+MB+TROPI)      Component Value Range   [place] CK 205[address] (*) 7 - 232 (U/L)   CK, MB 66.3 (*) 0.3 - 4.0 (ng/mL)   Troponin I 5.70 (*) <0.30 (ng/mL)   Relative Index 0.3  0.0 - 2.5   CARDIAC PANEL(CRET KIN+CKTOT+MB+TROPI)      Component Value Range   Total CK 36198 (*) 7 - 232 (U/L)   CK, MB 81.1 (*) 0.3 - 4.0 (ng/mL)   Troponin I 6.28 (*) <0.30 (ng/mL)   Relative Index 0.2  0.0 - 2.5   PROTIME-INR      Component Value Range   Prothrombin Time 14.7  11.6 - 15.2 (seconds)   INR  1.13  0.00 - 1.49   COMPREHENSIVE METABOLIC PANEL      Component Value Range   Sodium 139  135 - 145 (mEq/L)   Potassium 6.3 (*) 3.5 - 5.1 (mEq/L)   Chloride 105  96 - 112 (mEq/L)   CO2 23  19 - 32 (mEq/L)   Glucose, Bld 130 (*) 70 - 99 (mg/dL)   BUN 17  6 - 23 (mg/dL)   Creatinine, Ser 3.66 (*) 0.50 - 1.35 (mg/dL)   Calcium 7.1 (*) 8.4 - 10.5 (mg/dL)   Total Protein 6.6  6.0 - 8.3 (g/dL)   Albumin 3.5  3.5 - 5.2 (g/dL)   AST 440 (*) 0 - 37 (U/L)   ALT 119 (*) 0 - 53 (U/L)   Alkaline Phosphatase 71  39 - 117 (U/L)   Total Bilirubin 0.7  0.3 - 1.2  (mg/dL)   GFR calc non Af Amer 25 (*) >90 (mL/min)   GFR calc Af Amer 29 (*) >90 (mL/min)  CBC      Component Value Range   WBC 25.7 (*) 4.0 - 10.5 (K/uL)   RBC 5.47  4.22 - 5.81 (MIL/uL)   Hemoglobin 17.4 (*) 13.0 - 17.0 (g/dL)   HCT 34.7  42.5 - 95.6 (%)   MCV 92.3  78.0 - 100.0 (fL)   MCH 31.8  26.0 - 34.0 (pg)   MCHC 34.5  30.0 - 36.0 (g/dL)   RDW 38.7  56.4 - 33.2 (%)   Platelets 189  150 - 400 (K/uL)  URINE RAPID DRUG SCREEN (HOSP PERFORMED)      Component Value Range   Opiates POSITIVE (*) NONE DETECTED    Cocaine NONE DETECTED  NONE DETECTED    Benzodiazepines POSITIVE (*) NONE DETECTED    Amphetamines NONE DETECTED  NONE DETECTED    Tetrahydrocannabinol NONE DETECTED  NONE DETECTED    Barbiturates NONE DETECTED  NONE DETECTED   PROCALCITONIN      Component Value Range   Procalcitonin 10.56    MAGNESIUM      Component Value Range   Magnesium 1.5  1.5 - 2.5 (mg/dL)  PHOSPHORUS      Component Value Range   Phosphorus 2.5  2.3 - 4.6 (mg/dL)  AMYLASE      Component Value Range   Amylase 161 (*) 0 - 105 (U/L)  LIPASE, BLOOD      Component Value Range   Lipase 24  11 - 59 (U/L)  APTT      Component Value Range   aPTT 30  24 - 37 (seconds)  TYPE AND SCREEN      Component Value Range   ABO/RH(D) O POS     Antibody Screen NEG     Sample Expiration 07/23/2011    STREP PNEUMONIAE URINARY ANTIGEN      Component Value Range   Strep Pneumo Urinary Antigen NEGATIVE  NEGATIVE   POCT I-STAT 3, BLOOD GAS (G3+)      Component Value Range   pH, Arterial 7.256 (*) 7.350 - 7.450    pCO2 arterial 48.8 (*) 35.0 - 45.0 (mmHg)   pO2, Arterial 81.0  80.0 - 100.0 (mmHg)   Bicarbonate 21.7  20.0 - 24.0 (mEq/L)   TCO2 23  0 - 100 (mmol/L)   O2 Saturation 94.0     Acid-base deficit 6.0 (*) 0.0 - 2.0 (mmol/L)   Collection site RADIAL, ALLEN'S TEST ACCEPTABLE     Drawn by Operator     Sample type ARTERIAL  ABO/RH      Component Value Range   ABO/RH(D) O POS    MRSA PCR  SCREENING      Component Value Range   MRSA by PCR NEGATIVE  NEGATIVE   GLUCOSE, CAPILLARY      Component Value Range   Glucose-Capillary 162 (*) 70 - 99 (mg/dL)  BASIC METABOLIC PANEL      Component Value Range   Sodium 140  135 - 145 (mEq/L)   Potassium 5.1  3.5 - 5.1 (mEq/L)   Chloride 104  96 - 112 (mEq/L)   CO2 26  19 - 32 (mEq/L)   Glucose, Bld 180 (*) 70 - 99 (mg/dL)   BUN 19  6 - 23 (mg/dL)   Creatinine, Ser 1.61 (*) 0.50 - 1.35 (mg/dL)   Calcium 7.3 (*) 8.4 - 10.5 (mg/dL)   GFR calc non Af Amer 30 (*) >90 (mL/min)   GFR calc Af Amer 35 (*) >90 (mL/min)  POCT I-STAT 3, BLOOD GAS (G3+)      Component Value Range   pH, Arterial 7.305 (*) 7.350 - 7.450    pCO2 arterial 47.8 (*) 35.0 - 45.0 (mmHg)   pO2, Arterial 112.0 (*) 80.0 - 100.0 (mmHg)   Bicarbonate 23.6  20.0 - 24.0 (mEq/L)   TCO2 25  0 - 100 (mmol/L)   O2 Saturation 98.0     Acid-base deficit 3.0 (*) 0.0 - 2.0 (mmol/L)   Patient temperature 99.8 F     Collection site RADIAL, ALLEN'S TEST ACCEPTABLE     Drawn by Operator     Sample type ARTERIAL    POCT I-STAT 3, BLOOD GAS (G3+)      Component Value Range   pH, Arterial 7.306 (*) 7.350 - 7.450    pCO2 arterial 48.8 (*) 35.0 - 45.0 (mmHg)   pO2, Arterial 84.0  80.0 - 100.0 (mmHg)   Bicarbonate 24.2 (*) 20.0 - 24.0 (mEq/L)   TCO2 26  0 - 100 (mmol/L)   O2 Saturation 95.0     Acid-base deficit 2.0  0.0 - 2.0 (mmol/L)   Patient temperature 99.8 F     Collection site RADIAL, ALLEN'S TEST ACCEPTABLE     Drawn by Operator     Sample type ARTERIAL    POCT I-STAT 3, BLOOD GAS (G3+)      Component Value Range   pH, Arterial 7.276 (*) 7.350 - 7.450    pCO2 arterial 52.7 (*) 35.0 - 45.0 (mmHg)   pO2, Arterial 100.0  80.0 - 100.0 (mmHg)   Bicarbonate 24.4 (*) 20.0 - 24.0 (mEq/L)   TCO2 26  0 - 100 (mmol/L)   O2 Saturation 96.0     Acid-base deficit 3.0 (*) 0.0 - 2.0 (mmol/L)   Patient temperature 99.8 F     Collection site RADIAL, ALLEN'S TEST ACCEPTABLE      Drawn by Operator     Sample type ARTERIAL     Ct Head Wo Contrast  07/20/2011  *RADIOLOGY REPORT*  Clinical Data: Overdose.  Ventilator support.  CT HEAD WITHOUT CONTRAST  Technique:  Contiguous axial images were obtained from the base of the skull through the vertex without contrast.  Comparison: None.  Findings: The brain has a normal appearance for age.  No evidence of old or acute infarction, mass lesion, hemorrhage, hydrocephalus or extra-axial collection.  Early brain swelling cannot be excluded but is not specifically established.  The calvarium is unremarkable.  No inflammatory sinus disease.  IMPRESSION: Negative head CT.  One could not exclude early generalized brain swelling, but this is not definitely present and I think the examination is within normal limits for a person of this age.  Original Report Authenticated By: Thomasenia Sales, M.D.   Dg Chest Portable 1 View  07/20/2011  *RADIOLOGY REPORT*  Clinical Data: Drug overdose.  Line placement and endotracheal tube placement.  PORTABLE CHEST - 1 VIEW  Comparison: Chest radiograph 07/20/2011  Findings: Two images were performed, as the left lung base is excluded from the first image. Image quality slightly degraded by portable technique.  The endotracheal tube is in satisfactory position, terminating approximately 4.8 cm above the carina.  A left IJ central venous catheter terminates in the proximal superior vena cava. A nasogastric tube can be followed into the stomach and continues below the edge of the image.  No evidence of pneumothorax. Heart, mediastinal, and hilar contours appear within normal limits.  There are patchy left perihilar and medial bibasilar opacities.  No visible pleural effusion or pneumothorax.  No acute bony abnormality identified.  IMPRESSION:  1.  Satisfactory position of endotracheal tube. 2.  Left IJ central venous catheter terminates in the proximal superior vena cava.  No evidence of pneumothorax. 3.  Patchy left  perihilar and bibasilar opacities.  This could reflect atelectasis and/or airspace disease.  Original Report Authenticated By: Britta Mccreedy, M.D.   Dg Chest Portable 1 View  07/20/2011  *RADIOLOGY REPORT*  Clinical Data: Endotracheal tube placement.  Drug overdose.  PORTABLE CHEST - 1 VIEW  Comparison: No priors.  Findings: The patient is indicated have with the tip of the endotracheal tube approximately 5.6 cm above the carina.  Lung volumes are low.  There are patchy bibasilar opacities (right greater than left), concerning for sequela of aspiration, likely with superimposed atelectasis.  No pleural effusions.  Pulmonary vasculature is normal.  Heart size and mediastinal contours are within normal limits.  IMPRESSION: 1.  Tip of endotracheal tube appears properly located. 2.  Patchy bibasilar opacities favored to represent sequela of aspiration.  Original Report Authenticated By: Florencia Reasons, M.D.    Course in ED:  Pt was seen STAT on arrival.  He was unresponsive with hyperventilation and respiratory distress.  He was therefore intubated by RSI, with good control of airway.  IV sedation orders were given.  Laboratory tests were ordered.  His lab tests showed elevated WBC of 32,000.  Chemistries showed K of 6.8, creatinine of 3.94.  UDS was positive for opiates.  Chest x-ray showed satisfactory ET tube position and evidence of aspiration pneumonia.  Pulmonary and Critical Care was called, and Dr. Katina Degree saw and admitted pt.     1. Altered mental status   2. Narcotic overdose   3. Aspiration pneumonia            Carleene Cooper III, MD 07/20/11 2222

## 2011-07-20 NOTE — Progress Notes (Signed)
eLink Physician-Brief Progress Note Patient Name: Ronald Greer DOB: Sep 25, 1975 MRN: [id number]  Date of Service  07/20/2011   HPI/Events of Note  Labs- K lower, CK rising from 20k to 36k   eICU Interventions  Ct bicarb @125  Add NS @100 /h   Intervention Category Intermediate Interventions: Diagnostic test evaluation  Shiara Mcgough V. 07/20/2011, 10:04 PM

## 2011-07-20 NOTE — ED Notes (Signed)
Dr Molli Knock in to see patient

## 2011-07-20 NOTE — ED Notes (Signed)
Pt found [facility]ponsive [facility]. Upon ems arrival pt had snoring resp and unresponsive to any stimuli. IV rt ac pt given 4 narcan and had nasel trumpet in place  Has track marks rt upper arm and hx of heroin abuse pt had lost of oral secreations pupils at first pinpoint and now dilated. Pt intubated after 2 nd IV started # 8 et tube taped 24 at the lip

## 2011-07-20 NOTE — ED Notes (Signed)
Critical care  Drs aware of ALL critical labs values  That were just called to nurse

## 2011-07-20 NOTE — ED Notes (Signed)
Patient transported to CT 

## 2011-07-20 NOTE — ED Notes (Signed)
Vent setting  resp rate 20 tv 550 5 peep 100%

## 2011-07-20 NOTE — ED Notes (Signed)
Xray done for tube placement and it needed to be down mmore which resp did x 2cm 26 at the lip now per dr Ignacia Palma order. Labs drawn w/ 2nd IV prior to entubation pt received 100mg  lido IV, 30 mg etomidate IV and 150 of succ IV

## 2011-07-21 ENCOUNTER — Inpatient Hospital Stay (HOSPITAL_COMMUNITY): Payer: BC Managed Care – PPO

## 2011-07-21 DIAGNOSIS — J690 Pneumonitis due to inhalation of food and vomit: Secondary | ICD-10-CM

## 2011-07-21 DIAGNOSIS — R4182 Altered mental status, unspecified: Secondary | ICD-10-CM

## 2011-07-21 DIAGNOSIS — F1110 Opioid abuse, uncomplicated: Secondary | ICD-10-CM

## 2011-07-21 DIAGNOSIS — J9600 Acute respiratory failure, unspecified whether with hypoxia or hypercapnia: Secondary | ICD-10-CM

## 2011-07-21 LAB — PHOSPHORUS: Phosphorus: 1.9 mg/dL — ABNORMAL LOW (ref 2.3–4.6)

## 2011-07-21 LAB — GLUCOSE, CAPILLARY: Glucose-Capillary: 125 mg/dL — ABNORMAL HIGH (ref 70–99)

## 2011-07-21 LAB — BLOOD GAS, ARTERIAL
Acid-Base Excess: 7 mmol/L — ABNORMAL HIGH (ref 0.0–2.0)
Bicarbonate: 30.9 mEq/L — ABNORMAL HIGH (ref 20.0–24.0)
FIO2: 0.4 %
MECHVT: 450 mL
TCO2: 32.2 mmol/L (ref 0–100)
pCO2 arterial: 43.8 mmHg (ref 35.0–45.0)
pH, Arterial: 7.464 — ABNORMAL HIGH (ref 7.350–7.450)
pO2, Arterial: 90 mmHg (ref 80.0–100.0)

## 2011-07-21 LAB — CBC
HCT: 50.5 % (ref 39.0–52.0)
Hemoglobin: 17.4 g/dL — ABNORMAL HIGH (ref 13.0–17.0)
MCHC: 32.7 g/dL (ref 30.0–36.0)
MCV: 93.4 fL (ref 78.0–100.0)
Platelets: 152 10*3/uL (ref 150–400)
RBC: 5.47 MIL/uL (ref 4.22–5.81)
RDW: 12.6 % (ref 11.5–15.5)
WBC: 25.7 10*3/uL — ABNORMAL HIGH (ref 4.0–10.5)
WBC: 12.9 10*3/uL — ABNORMAL HIGH (ref 4.0–10.5)

## 2011-07-21 LAB — BASIC METABOLIC PANEL
Chloride: 103 mEq/L (ref 96–112)
Creatinine, Ser: 2.11 mg/dL — ABNORMAL HIGH (ref 0.50–1.35)
GFR calc Af Amer: 45 mL/min — ABNORMAL LOW (ref >90)
GFR calc non Af Amer: 39 mL/min — ABNORMAL LOW (ref >90)

## 2011-07-21 LAB — CARDIAC PANEL(CRET KIN+CKTOT+MB+TROPI)
CK, MB: 56.1 ng/mL (ref 0.3–4.0)
Relative Index: 0.2 (ref 0.0–2.5)
Total CK: 34364 U/L — ABNORMAL HIGH (ref 7–232)
Troponin I: 5.19 ng/mL (ref <0.30)

## 2011-07-21 LAB — URINE CULTURE

## 2011-07-21 LAB — LACTIC ACID, PLASMA: Lactic Acid, Venous: 1.7 mmol/L (ref 0.5–2.2)

## 2011-07-21 LAB — LEGIONELLA ANTIGEN, URINE

## 2011-07-21 LAB — MAGNESIUM: Magnesium: 1.4 mg/dL — ABNORMAL LOW (ref 1.5–2.5)

## 2011-07-21 MED ORDER — SODIUM CHLORIDE 0.9 % IJ SOLN
INTRAMUSCULAR | Status: AC
  Administered 2011-07-21: 13:00:00
  Filled 2011-07-21: qty 10

## 2011-07-21 MED ORDER — SODIUM PHOSPHATE 3 MMOLE/ML IV SOLN
30.0000 mmol | Freq: Once | INTRAVENOUS | Status: AC
  Administered 2011-07-21: 30 mmol via INTRAVENOUS
  Filled 2011-07-21: qty 10

## 2011-07-21 MED ORDER — HEPARIN SODIUM (PORCINE) 5000 UNIT/ML IJ SOLN
5000.0000 [IU] | Freq: Three times a day (TID) | INTRAMUSCULAR | Status: DC
  Administered 2011-07-21 – 2011-07-27 (×16): 5000 [IU] via SUBCUTANEOUS
  Filled 2011-07-21 (×22): qty 1

## 2011-07-21 MED ORDER — PIPERACILLIN-TAZOBACTAM 3.375 G IVPB
3.3750 g | Freq: Three times a day (TID) | INTRAVENOUS | Status: DC
  Administered 2011-07-21 – 2011-07-23 (×5): 3.375 g via INTRAVENOUS
  Filled 2011-07-21 (×8): qty 50

## 2011-07-21 MED ORDER — VANCOMYCIN HCL 1000 MG IV SOLR
1500.0000 mg | INTRAVENOUS | Status: DC
  Administered 2011-07-21: 1500 mg via INTRAVENOUS
  Filled 2011-07-21 (×2): qty 1500

## 2011-07-21 MED ORDER — HALOPERIDOL LACTATE 5 MG/ML IJ SOLN: 1.0000 mg | INTRAMUSCULAR | Status: DC | PRN

## 2011-07-21 MED ORDER — INSULIN ASPART 100 UNIT/ML ~~LOC~~ SOLN
0.0000 [IU] | SUBCUTANEOUS | Status: DC
  Administered 2011-07-21: 1 [IU] via SUBCUTANEOUS

## 2011-07-21 MED FILL — Etomidate IV Soln 2 MG/ML: INTRAVENOUS | Qty: 20 | Status: AC

## 2011-07-21 MED FILL — Lidocaine HCl IV Inj 20 MG/ML: INTRAVENOUS | Qty: 5 | Status: AC

## 2011-07-21 MED FILL — Succinylcholine Chloride Inj 20 MG/ML: INTRAMUSCULAR | Qty: 10 | Status: AC

## 2011-07-21 NOTE — Progress Notes (Signed)
eLink Physician-Brief Progress Note Patient Name: Ronald Greer DOB: [birth year] MRN: [id number]  Date of Service  07/21/2011   HPI/Events of Note  Hypophosphatemia   eICU Interventions  Phos replaced   Intervention Category Intermediate Interventions: Electrolyte abnormality - evaluation and management  Keimya Briddell 07/21/2011, 5:52 AM

## 2011-07-21 NOTE — Progress Notes (Signed)
  Echocardiogram 2D Echocardiogram has been performed.  Chrysta Fulcher, Real Cons 07/21/2011, 2:47 PM

## 2011-07-21 NOTE — Progress Notes (Signed)
Ronald Greer is a 36 y.o. male smoker admitted on 07/20/2011 with altered mental status, renal failure, elevated cardiac enzymes, elevated LFT's.  No response to narcan in ED.  Intubated for airway protection and concern for aspiration.  Reported hx of IVDA.  Line/tube: ETT 3/13>> Lt IJ CVL 3/13>>  Cultures: Urine 3/13>> MRSA screen 3/13>>negative Blood 3/13>> Sputum 3/13>>  Antibiotics: Vancomycin 3/13>> Zosyn 3/13>>  Tests/events: 3/13 CT head>>negative  SUBJECTIVE: Agitated on WUA.  Tolerating pressure support.  OBJECTIVE:  Blood pressure 139/67, pulse 89, temperature 99.5 F (37.5 C), temperature source Core (Comment), resp. rate 24, height 5\' 11"  (1.803 m), weight 255 lb 4.7 oz (115.8 kg), SpO2 96.00%. Wt Readings from Last 3 Encounters:  07/21/11 255 lb 4.7 oz (115.8 kg)   Body mass index is 35.61 kg/(m^2).  I/O last 3 completed shifts: In: 2978.4 [I.V.:2025.4; NG/GT:300; IV Piggyback:653] Out: 2435 [Urine:2085; Stool:350]  Vent Mode:  [-] PRVC FiO2 (%):  [39.8 %-100 %] 40.4 % Set Rate:  [20 bmp-35 bmp] 35 bmp Vt Set:  [450 mL-650 mL] 450 mL PEEP:  [5 cmH20-10.4 cmH20] 5 cmH20 Plateau Pressure:  [12 cmH20-24 cmH20] 23 cmH20   Physical exam: General - no distress HEENT - ETT in place Cardiac - s1s2 regular, no murmur Chest - scattered rhonchi Abd - soft, nontender Ext - no edema Neuro - somnolent, easily arousable  CBC    Component Value Date/Time   WBC 12.9* 07/21/2011 0500   RBC 4.68 07/21/2011 0500   HGB 14.3 07/21/2011 0500   HCT 43.7 07/21/2011 0500   PLT 152 07/21/2011 0500   MCV 93.4 07/21/2011 0500   MCH 30.6 07/21/2011 0500   MCHC 32.7 07/21/2011 0500   RDW 12.6 07/21/2011 0500   LYMPHSABS 1.3 07/20/2011 1150   MONOABS 0.6 07/20/2011 1150   EOSABS 0.0 07/20/2011 1150   BASOSABS 0.0 07/20/2011 1150    BMET    Component Value Date/Time   NA 142 07/21/2011 0500   K 3.1* 07/21/2011 0500   CL 103 07/21/2011 0500   CO2 32 07/21/2011 0500   GLUCOSE  163* 07/21/2011 0500   BUN 21 07/21/2011 0500   CREATININE 2.11* 07/21/2011 0500   CALCIUM 7.0* 07/21/2011 0500   GFRNONAA 39* 07/21/2011 0500   GFRAA 45* 07/21/2011 0500    Lab Results  Component Value Date   ALT 119* 07/20/2011   AST 322* 07/20/2011   ALKPHOS 71 07/20/2011   BILITOT 0.7 07/20/2011   ABG    Component Value Date/Time   PHART 7.464* 07/21/2011 0444   PCO2ART 43.8 07/21/2011 0444   PO2ART 90.0 07/21/2011 0444   HCO3 30.9* 07/21/2011 0444   TCO2 32.2 07/21/2011 0444   ACIDBASEDEF 3.0* 07/20/2011 1858   O2SAT 97.7 07/21/2011 0444    Ct Head Wo Contrast  07/20/2011  *RADIOLOGY REPORT*  Clinical Data: Overdose.  Ventilator support.  CT HEAD WITHOUT CONTRAST  Technique:  Contiguous axial images were obtained from the base of the skull through the vertex without contrast.  Comparison: None.  Findings: The brain has a normal appearance for age.  No evidence of old or acute infarction, mass lesion, hemorrhage, hydrocephalus or extra-axial collection.  Early brain swelling cannot be excluded but is not specifically established.  The calvarium is unremarkable.  No inflammatory sinus disease.  IMPRESSION: Negative head CT.  One could not exclude early generalized brain swelling, but this is not definitely present and I think the examination is within normal limits for a person of this age.  Original Report Authenticated By: Thomasenia Sales, M.D.   Dg Chest Port 1 View  07/21/2011  *RADIOLOGY REPORT*  Clinical Data: Evaluate endotracheal tube position  PORTABLE CHEST - 1 VIEW  Comparison: 07/20/2011  Findings: Grossly unchanged cardiac silhouette and mediastinal contours.  Stable position of support apparatus.  No pneumothorax. Interval improved aeration of the right mid lung with unchanged left mid and lower lung heterogeneous air space opacities.  Mild poor venous congestion without frank evidence of pulmonary edema. No definite pleural effusion or pneumothorax.  Unchanged bones.  IMPRESSION: 1.   Stable positioning of support apparatus.  No pneumothorax. 2.  No change to minimal improved aeration of the right mid lung with persistent left mid and lower lung heterogeneous air space opacities worrisome for infection/aspiration.  Original Report Authenticated By: Waynard Reeds, M.D.   Dg Chest Portable 1 View  07/20/2011  *RADIOLOGY REPORT*  Clinical Data: Drug overdose.  Line placement and endotracheal tube placement.  PORTABLE CHEST - 1 VIEW  Comparison: Chest radiograph 07/20/2011  Findings: Two images were performed, as the left lung base is excluded from the first image. Image quality slightly degraded by portable technique.  The endotracheal tube is in satisfactory position, terminating approximately 4.8 cm above the carina.  A left IJ central venous catheter terminates in the proximal superior vena cava. A nasogastric tube can be followed into the stomach and continues below the edge of the image.  No evidence of pneumothorax. Heart, mediastinal, and hilar contours appear within normal limits.  There are patchy left perihilar and medial bibasilar opacities.  No visible pleural effusion or pneumothorax.  No acute bony abnormality identified.  IMPRESSION:  1.  Satisfactory position of endotracheal tube. 2.  Left IJ central venous catheter terminates in the proximal superior vena cava.  No evidence of pneumothorax. 3.  Patchy left perihilar and bibasilar opacities.  This could reflect atelectasis and/or airspace disease.  Original Report Authenticated By: Britta Mccreedy, M.D.   Dg Chest Portable 1 View  07/20/2011  *RADIOLOGY REPORT*  Clinical Data: Endotracheal tube placement.  Drug overdose.  PORTABLE CHEST - 1 VIEW  Comparison: No priors.  Findings: The patient is indicated have with the tip of the endotracheal tube approximately 5.6 cm above the carina.  Lung volumes are low.  There are patchy bibasilar opacities (right greater than left), concerning for sequela of aspiration, likely with  superimposed atelectasis.  No pleural effusions.  Pulmonary vasculature is normal.  Heart size and mediastinal contours are within normal limits.  IMPRESSION: 1.  Tip of endotracheal tube appears properly located. 2.  Patchy bibasilar opacities favored to represent sequela of aspiration.  Original Report Authenticated By: Florencia Reasons, M.D.    ASSESSMENT/PLAN:  Acute respiratory failure 2nd to change mental status and probable aspiration -pressure support wean as tolerated>>hopefully extubate soon -D1/x vancomycin, zosyn -f/u CXR  Acute renal failure likely from volume depletion, rhabdo>>improving -continue IV fluid -?blocked foley 3/14>>bladder scan -f/u BMET  Metabolic acidosis -resolved  Metabolic encephalopathy with reported hx of IVDA -hold sedation while weaning vent -monitor mental status  Elevated cardiac enzymes in setting of acute renal failure -f/u Echo -may need cardiology evaluation when more stable  Elevated LFT's -f/u labs -check hepatitis panel, HIV  Low K, Mg, Ph -f/u and replace as needed  Hyperglycemia  -SSI  Critical care time 35 minutes  Kosha Jaquith Pager:  [phone] 07/21/2011, 10:53 AM

## 2011-07-21 NOTE — Progress Notes (Signed)
INITIAL ADULT NUTRITION ASSESSMENT Date: 07/21/2011   Time: 8:35 AM  Reason for Assessment: TF Consult  ASSESSMENT: Male 36 y.o.  Dx: Narcotic overdose, aspiration PNA  Hx:  Past Medical History  Diagnosis Date  . Intravenous drug abuse, continuous   . Alcohol abuse   . Headache     Related Meds:     . antiseptic oral rinse  15 mL Mouth Rinse QID  . calcium gluconate  1 g Intravenous Once  . calcium gluconate      . chlorhexidine  15 mL Mouth Rinse BID  . dextrose  50 mL Intravenous Once  . insulin aspart      . insulin regular  10 Units Intravenous Once  . pantoprazole (PROTONIX) IV  40 mg Intravenous QHS  . piperacillin-tazobactam (ZOSYN)  IV  2.25 g Intravenous To Major  . piperacillin-tazobactam (ZOSYN)  IV  2.25 g Intravenous Q6H  . sodium bicarbonate  50 mEq Intravenous Once  . sodium chloride      . sodium phosphate  Dextrose 5% IVPB  30 mmol Intravenous Once  . sodium polystyrene  60 g Per Tube Once  . vancomycin  1,500 mg Intravenous To Major  . vancomycin  1,500 mg Intravenous Q48H  . DISCONTD: fentaNYL  2 mcg/kg Intravenous Once    Ht: 5\' 11"  (180.3 cm)  Wt: 255 lb 4.7 oz (115.8 kg)  Ideal Wt: 78.2 kg % Ideal Wt: 148%  Usual Wt: unable to attain as patient is intubated % Usual Wt: --  Body mass index is 35.61 kg/(m^2). Class II Obesity  Food/Nutrition Related Hx: History of alcohol abuse. Was dehydrated and presented with acute renal failure.upon admission. Is being repleted with fluids and hyperkalemia is being addressed.    Has loose BM documented on 3/14.  No wounds to document. OGT is placed  Patient is at risk for refeeding syndrome due to history of alcoholism, and low levels of potassium, magnesium, and phosphorus. Patient is currently being repleted with phosphorus due to hypophosphatemic episode on 3/14.   Labs:  CMP     Component Value Date/Time   NA 142 07/21/2011 0500   K 3.1* 07/21/2011 0500   CL 103 07/21/2011 0500   CO2 32  07/21/2011 0500   GLUCOSE 163* 07/21/2011 0500   BUN 21 07/21/2011 0500   CREATININE 2.11* 07/21/2011 0500   CALCIUM 7.0* 07/21/2011 0500   PROT 6.6 07/20/2011 1335   ALBUMIN 3.5 07/20/2011 1335   AST 322* 07/20/2011 1335   ALT 119* 07/20/2011 1335   ALKPHOS 71 07/20/2011 1335   BILITOT 0.7 07/20/2011 1335   GFRNONAA 39* 07/21/2011 0500   GFRAA 45* 07/21/2011 0500  Phosphorus 1.9 L (3/14) Magnesium 1.4 L (3/14)   Intake:   Intake/Output Summary (Last 24 hours) at 07/21/11 0839 Last data filed at 07/21/11 0800  Gross per 24 hour  Intake 3001.35 ml  Output   2435 ml  Net 566.35 ml     Diet Order: NPO  Supplements/Tube Feeding: N/A  IVF:    sodium chloride Last Rate: 100 mL/hr at 07/20/11 2214  fentaNYL infusion INTRAVENOUS Last Rate: 100 mcg/hr (07/21/11 0732)  midazolam (VERSED) infusion Last Rate: 3 mg/hr (07/21/11 0433)  DISCONTD: sodium chloride   DISCONTD: fentaNYL infusion INTRAVENOUS Last Rate: 75 mcg/hr (07/20/11 1800)  DISCONTD: midazolam (VERSED) infusion Last Rate: 2 mg/hr (07/20/11 1900)  DISCONTD:  sodium bicarbonate infusion 1000 mL Last Rate: Stopped (07/21/11 0622)    Estimated Nutritional Needs:   Kcal: 2500 -  2600 (Calorie goal for underfeeding guidelines: 1500 - 1750 kcal/day) Protein: 156 - 166 gm Fluid: > 2.5 L  RN reported possible weaning of sedation and extubation today. Unsure of nutrition support plan at this time.  OG tube is in place.  NUTRITION DIAGNOSIS: -Inadequate oral intake (NI-2.1).  Status: Ongoing  RELATED TO: inability to eat  AS EVIDENCE BY: NPO status  MONITORING/EVALUATION(Goals): Goal: Per ASPEN guidelines for permissive underfeeding, goal of EN regimen should not exceed 60-70% of target energy requirements (22-25 kcals/kg IBW, 10-14 kcal actual body weight). Protein should be provided at 100% of estimated needs Monitor: Extubation, TF initiation, I/O, labs, weights   EDUCATION NEEDS: -No education needs identified at this  time  INTERVENTION:  If unable to advance to PO intake, and EN is warranted, recommend initiating Promote at 20 ml/hr via OGT, increase by 10 ml/hr every 12 hours to goal rate of 60 ml/hr. Supplement with 30 ml Pro-Stat four times daily.   This regimen will provide 1728 kcal (69% estimated energy needs), 150 gram protein (96% of estimated protein needs), and 1208 ml free water   RD to follow nutrition care plan  Dietitian #:[phone]  DOCUMENTATION CODES Per approved criteria  -Obesity Unspecified    Lloyd Huger 07/21/2011, 8:35 AM  Hettie Holstein [phone]

## 2011-07-21 NOTE — Progress Notes (Signed)
ANTIBIOTIC CONSULT NOTE - FOLLOW UP  Pharmacy Consult for Vancomycin/Zosyn Indication: Empiric - presumed aspiration PNA  No Known Allergies  Patient Measurements: Height: 5\' 11"  (180.3 cm) Weight: 255 lb 4.7 oz (115.8 kg) IBW/kg (Calculated) : 75.3   Vital Signs: Temp: 99.3 F (37.4 C) (03/14 0801) Temp src: Core (Comment) (03/14 0700) BP: 141/68 mmHg (03/14 0801) Pulse Rate: 87  (03/14 0801) Intake/Output from previous day: 03/13 0701 - 03/14 0700 In: 2978.4 [I.V.:2025.4; NG/GT:300; IV Piggyback:653] Out: 2435 [Urine:2085; Stool:350] Intake/Output from this shift: Total I/O In: 23 [I.V.:23] Out: -   Labs:  Basename 07/21/11 0500 07/20/11 1940 07/20/11 1335 07/20/11 1150  WBC 12.9* -- 25.7* 32.1*  HGB 14.3 -- 17.4* 18.7*  PLT 152 -- 189 286  LABCREA -- -- -- --  CREATININE 2.11* 2.60* 3.02* --   Estimated Creatinine Clearance: 62.6 ml/min (by C-G formula based on Cr of 2.11). No results found for this basename: VANCOTROUGH:2,VANCOPEAK:2,VANCORANDOM:2,GENTTROUGH:2,GENTPEAK:2,GENTRANDOM:2,TOBRATROUGH:2,TOBRAPEAK:2,TOBRARND:2,AMIKACINPEAK:2,AMIKACINTROU:2,AMIKACIN:2, in the last 72 hours   Microbiology: Recent Results (from the past 720 hour(s))  MRSA PCR SCREENING     Status: Normal   Collection Time   07/20/11  3:56 PM      Component Value Range Status Comment   MRSA by PCR NEGATIVE  NEGATIVE  Final   CULTURE, RESPIRATORY     Status: Normal (Preliminary result)   Collection Time   07/20/11  6:28 PM      Component Value Range Status Comment   Specimen Description ENDOTRACHEAL   Final    Special Requests NONE   Final    Gram Stain     Final    Value: FEW WBC PRESENT,BOTH PMN AND MONONUCLEAR     NO WBC SEEN     RARE GRAM POSITIVE COCCI IN PAIRS   Culture PENDING   Incomplete    Report Status PENDING   Incomplete     Anti-infectives     Start     Dose/Rate Route Frequency Ordered Stop   07/22/11 1500   vancomycin (VANCOCIN) 1,500 mg in sodium chloride 0.9 % 500  mL IVPB  Status:  Discontinued        1,500 mg 250 mL/hr over 120 Minutes Intravenous Every 48 hours 07/20/11 1632 07/21/11 0853   07/21/11 1500   vancomycin (VANCOCIN) 1,500 mg in sodium chloride 0.9 % 500 mL IVPB        1,500 mg 250 mL/hr over 120 Minutes Intravenous Every 24 hours 07/21/11 0853     07/21/11 1000  piperacillin-tazobactam (ZOSYN) IVPB 3.375 g       3.375 g 12.5 mL/hr over 240 Minutes Intravenous Every 8 hours 07/21/11 0854     07/20/11 2200   piperacillin-tazobactam (ZOSYN) IVPB 2.25 g  Status:  Discontinued        2.25 g 100 mL/hr over 30 Minutes Intravenous Every 6 hours 07/20/11 1632 07/21/11 0853   07/20/11 1430   vancomycin (VANCOCIN) 1,500 mg in sodium chloride 0.9 % 500 mL IVPB        1,500 mg 250 mL/hr over 120 Minutes Intravenous To [facility] 07/20/11 1429 07/20/11 1729   07/20/11 1430  piperacillin-tazobactam (ZOSYN) IVPB 2.25 g       2.25 g 100 mL/hr over 30 Minutes Intravenous To [facility] 07/20/11 1429 07/20/11 1630          Assessment: 36 yo M w/ hx of heroin use found unresponsive. Admitted on 3/13 for encephalopathy, acute resp failure (intubated), presumed aspiration PNA, & ARF w/  hyperkalemia (6.8).  Anticoagulation: SCDs for VTE ppx  Infectious Disease: Presumed aspiration PNA: Vanc/Zosyn D#2. WBC elevated, currently afebrile. PCT 12. MRSA PCR (-) Vanc 3/13>> Zosyn 3/13>> (3/13) Resp Cx >> (Rare gram positive cocci in pairs) (3/13) BCx >> NGTD (3/13) UCx >> pending  Cardiovascular: BP, HR OK. Troponin (+) x 3. Not on any CV meds.    Endocrinology: No hx of diabetes. Recent BGs: 162-180.   Gastrointestinal / Nutrition: NPO. PPI IV on vent.   Neurology: RASS 0 on continuous fentanyl 74mcg/hr & versed 1.5mg /hr drips.   Nephrology: ARF: IVF - NS @ 142ml/hr. Renal fxn much improved. UOP 0.54ml/kg/hr. 3/13- Hyperkalemia - gave kayexalate 60g x 1 dose. 3/14- Hypokalemia & hypomagnesemia - f/u repletion. Low Phos -  NaPhos x 1 dose ordered. Initial acidosis - now alkalosis w/ high bicarb. Bicarb drip d/ced 3/14.   Pulmonary: Acute resp failure: 96% O2 sat on vent FiO2 40.4.   Hematology / Oncology: CBC WNL except elevated WBC  PTA Medication Issues: No PTA meds  Best Practices: MC, PPI IV (f/u change to PO or PT as appropriate), SCDs  Goal of Therapy:  Vancomycin trough level 15-20 mcg/ml  Plan:  Changed Zosyn dose to 3.375g IV Q8. Changed Vancomycin dose to 1500mg  IV Q24. Obtain vanc trough at steady state. Follow-up cultures.  Delphia Grates, PharmD Candidate 07/21/2011,8:55 AM   I agree with assessment and recommendations above. Rolland Porter, Pharm.D., BCPS Clinical Pharmacist Pager: [phone]

## 2011-07-21 NOTE — Progress Notes (Signed)
UR Completed.  Vangie Bicker [phone] 07/21/2011

## 2011-07-22 ENCOUNTER — Inpatient Hospital Stay (HOSPITAL_COMMUNITY): Payer: BC Managed Care – PPO

## 2011-07-22 ENCOUNTER — Encounter (HOSPITAL_COMMUNITY): Payer: Self-pay | Admitting: Nurse Practitioner

## 2011-07-22 DIAGNOSIS — J9600 Acute respiratory failure, unspecified whether with hypoxia or hypercapnia: Secondary | ICD-10-CM

## 2011-07-22 DIAGNOSIS — N179 Acute kidney failure, unspecified: Secondary | ICD-10-CM

## 2011-07-22 DIAGNOSIS — R4182 Altered mental status, unspecified: Secondary | ICD-10-CM

## 2011-07-22 DIAGNOSIS — I5021 Acute systolic (congestive) heart failure: Secondary | ICD-10-CM

## 2011-07-22 DIAGNOSIS — R7989 Other specified abnormal findings of blood chemistry: Secondary | ICD-10-CM

## 2011-07-22 LAB — CBC
HCT: 40.5 % (ref 39.0–52.0)
RDW: 12.5 % (ref 11.5–15.5)
WBC: 13.3 10*3/uL — ABNORMAL HIGH (ref 4.0–10.5)

## 2011-07-22 LAB — HEPATITIS PANEL, ACUTE
Hep A IgM: NEGATIVE
Hep B C IgM: NEGATIVE
Hepatitis B Surface Ag: NEGATIVE

## 2011-07-22 LAB — COMPREHENSIVE METABOLIC PANEL
Alkaline Phosphatase: 65 U/L (ref 39–117)
BUN: 14 mg/dL (ref 6–23)
CO2: 29 mEq/L (ref 19–32)
Calcium: 8 mg/dL — ABNORMAL LOW (ref 8.4–10.5)
GFR calc Af Amer: 76 mL/min — ABNORMAL LOW (ref >90)
GFR calc non Af Amer: 66 mL/min — ABNORMAL LOW (ref >90)
Glucose, Bld: 130 mg/dL — ABNORMAL HIGH (ref 70–99)
Potassium: 3.4 mEq/L — ABNORMAL LOW (ref 3.5–5.1)
Total Protein: 6.2 g/dL (ref 6.0–8.3)

## 2011-07-22 LAB — MAGNESIUM: Magnesium: 1.5 mg/dL (ref 1.5–2.5)

## 2011-07-22 LAB — GLUCOSE, CAPILLARY: Glucose-Capillary: 142 mg/dL — ABNORMAL HIGH (ref 70–99)

## 2011-07-22 LAB — PHOSPHORUS: Phosphorus: 1.7 mg/dL — ABNORMAL LOW (ref 2.3–4.6)

## 2011-07-22 LAB — HEMOGLOBIN A1C
Hgb A1c MFr Bld: 6.9 % — ABNORMAL HIGH (ref <5.7)
Mean Plasma Glucose: 151 mg/dL — ABNORMAL HIGH (ref <117)

## 2011-07-22 MED ORDER — OXYCODONE-ACETAMINOPHEN 5-325 MG PO TABS
1.0000 | ORAL_TABLET | Freq: Four times a day (QID) | ORAL | Status: DC | PRN
  Administered 2011-07-22 (×2): 1 via ORAL
  Administered 2011-07-22 – 2011-07-27 (×17): 2 via ORAL
  Filled 2011-07-22 (×2): qty 2
  Filled 2011-07-22: qty 1
  Filled 2011-07-22 (×13): qty 2
  Filled 2011-07-22: qty 1
  Filled 2011-07-22 (×2): qty 2

## 2011-07-22 MED ORDER — WHITE PETROLATUM GEL
Status: AC
  Administered 2011-07-22: 11:00:00
  Filled 2011-07-22: qty 5

## 2011-07-22 MED ORDER — PANTOPRAZOLE SODIUM 40 MG PO TBEC
40.0000 mg | DELAYED_RELEASE_TABLET | Freq: Every day | ORAL | Status: DC
  Administered 2011-07-22 – 2011-07-24 (×3): 40 mg via ORAL
  Filled 2011-07-22 (×3): qty 1

## 2011-07-22 MED ORDER — INSULIN ASPART 100 UNIT/ML ~~LOC~~ SOLN
0.0000 [IU] | Freq: Three times a day (TID) | SUBCUTANEOUS | Status: DC
  Administered 2011-07-23: 3 [IU] via SUBCUTANEOUS

## 2011-07-22 MED ORDER — SODIUM CHLORIDE 0.45 % IV SOLN
INTRAVENOUS | Status: DC
  Administered 2011-07-22: 09:00:00 via INTRAVENOUS
  Administered 2011-07-24: 1000 mL via INTRAVENOUS
  Administered 2011-07-25 – 2011-07-26 (×3): via INTRAVENOUS

## 2011-07-22 MED ORDER — ASPIRIN 81 MG PO CHEW
81.0000 mg | CHEWABLE_TABLET | Freq: Every day | ORAL | Status: DC
  Administered 2011-07-22 – 2011-07-27 (×6): 81 mg via ORAL
  Filled 2011-07-22 (×6): qty 1

## 2011-07-22 MED ORDER — METOPROLOL TARTRATE 12.5 MG HALF TABLET
12.5000 mg | ORAL_TABLET | Freq: Two times a day (BID) | ORAL | Status: DC
  Administered 2011-07-22 – 2011-07-27 (×10): 12.5 mg via ORAL
  Filled 2011-07-22 (×12): qty 1

## 2011-07-22 NOTE — Consult Note (Signed)
CARDIOLOGY CONSULT NOTE  Patient ID: Ronald Greer MRN: [id number] DOB/AGE: 11-08-1975 36 y.o.  Admit date: 07/20/2011 Referring Physician:  Craige Cotta Primary Physician:  ? Primary Cardiologist:  Riley Kill Reason for Consultation: Elevated cardiac enzymes  HPI:   36 year old with no prior cardiac history.  Presents after being found down.  His sister and mother are here, but know no of the details.  He went out late and was found perhaps about 12 hours later.  Information unavailable to me.  He has had sinus infections, and bronchitis, per his mother, but no complaints of chest pain.  He has history of drug use in the past.  Cardiac enzymes noted to be elevated, but in the context of marked elevation of total enzymes consistent with rhabdomyolysis. His main complaint is that he aches all over, and he is says he has no idea what happened, and no memory for the event.  The gentleman who accompanied him apparently has not provided history.  Findings of screens are noted.  He does deny to me use of anabolic steroids, and does deny drug use.     Additional PMH:  Shoulder surgery, frequent bronchitis.  Leg fracture in femur with football. SH:  Patient does not smoke.  Has roommate.  Works as Systems analyst.       Past Medical History  Diagnosis Date  . Intravenous drug abuse, continuous   . Alcohol abuse   . Headache   . No pertinent past medical history     Family History  Problem Relation Age of Onset  . Hyperlipidemia Mother   . Hypertension Mother     History   Social History  . Marital Status: Divorced    Spouse Name: N/A    Number of Children: N/A  . Years of Education: N/A   Occupational History  . Not on file.   Social History Main Topics  . Smoking status: Current Everyday Smoker  . Smokeless tobacco: Not on file  . Alcohol Use: Yes  . Drug Use: Yes    Special: IV, "Crack" cocaine, Heroin  . Sexually Active: No     herion   Other Topics Concern  . Not on file   Social  History Narrative  . No narrative on file    Past Surgical History  Procedure Date  . No past surgeries      Prescriptions prior to admission  Medication Sig Dispense Refill  . Multiple Vitamins-Minerals (MULTIVITAMINS THER. W/MINERALS) TABS Take 1 tablet by mouth daily.      Unable to provide meaningful ROS.  Aches all over.  Does not want to answer other questions.    Physical Exam: Young male in no acute distress.  BP 154/87;  P 81, R 13, T as recorded in profile in EPIC flow. JVP not elevated Lungs clear anteriorly PMI non displaced.  Normal S1 and S2.  No murmur, rub, or S4 or S3 gallop. Abdomen currently soft.  No organomegaly Ext  No edema Neuro:  Wakes up intermittently and answers questions.  Complains of muscle pain.      Lab Results  Component Value Date   WBC 13.3* 07/22/2011   HGB 13.3 07/22/2011   HCT 40.5 07/22/2011   MCV 94.4 07/22/2011   PLT 150 07/22/2011    Lab 07/22/11 0500  NA 142  K 3.4*  CL 105  CO2 29  BUN 14  CREATININE 1.36*  CALCIUM 8.0*  PROT 6.2  BILITOT 0.8  ALKPHOS 65  ALT  158*  AST 548*  GLUCOSE 130*   Lab Results  Component Value Date   CKTOTAL 27253* 07/21/2011   CKMB 56.1* 07/21/2011   TROPONINI 5.19* 07/21/2011    No results found for this basename: CHOL   No results found for this basename: HDL   No results found for this basename: LDLCALC   No results found for this basename: TRIG   No results found for this basename: CHOLHDL   No results found for this basename: LDLDIRECT       EKG:  Sinus tachycardia, rightward axis, no acute changes.  ECHO:  Study Conclusions  Left ventricle: The cavity size was normal. Systolic function was mildly reduced. The estimated ejection fraction was in the range of 45% to 50%. Mild diffuse hypokinesis.    ASSESSMENT AND PLAN:   1.  Elevated cardiac enzymes in setting of overall elevated enzymes   ---  Uncertain significance.  Doubt primary cardiac event. 2.  Reduced overall LV  function globally in setting of acute illness  ---  Rhabdomyolysis. 3.  Uncertain presentation at present  ----   ?? Drug overdose with immobility and rhabdo 4.  Prior shoulder surgery 5.  Recent sinusits   Recommendations  1.  Serial cardiac enzymes with additional panel tomorrow.   2.  Repeat ECG in the am 3.  Better elucidation of history---this is critical within the context of medical planning.   4.  Continue to monitor renal function. 5.  Further cardiac workup when overall medical condition totally stable.  Might be candidate for coronary CTA when renal function is stabilized, and then follow up echocardiogram.    We will follow with you.   Signed:   Shawnie Pons, MD, [facility], FSCAI 07/22/2011, 4:14 PM

## 2011-07-22 NOTE — Progress Notes (Signed)
Ronald Greer is a 36 y.o. male smoker admitted on 07/20/2011 with altered mental status, renal failure, elevated cardiac enzymes, elevated LFT's.  No response to narcan in ED.  Intubated for airway protection and concern for aspiration.  Reported hx of IVDA.  Line/tube: ETT 3/13>>3/14 Lt IJ CVL 3/13>>3/15  Cultures: Urine 3/13>>negative MRSA screen 3/13>>negative Blood 3/13>> Sputum 3/13>>  Antibiotics: Vancomycin 3/13>>3/15 Zosyn 3/13>>  Tests/events: 3/13 CT head>>negative 3/14 Echo>>EF 45% to 50%. Mild diffuse hypokinesis.   SUBJECTIVE: Intermittent c/o chest pain.  Denies dyspnea, abd pain.  Has been feeling down recently related to work, but denies suicidal ideation.  He does not recall what happened before he came to hospital.  OBJECTIVE:  Blood pressure 154/82, pulse 97, temperature 98.5 F (36.9 C), temperature source Oral, resp. rate 21, height 5\' 11"  (1.803 m), weight 252 lb 6.8 oz (114.5 kg), SpO2 94.00%. Wt Readings from Last 3 Encounters:  07/22/11 252 lb 6.8 oz (114.5 kg)   Body mass index is 35.21 kg/(m^2).  I/O last 3 completed shifts: In: 4018.7 [P.O.:720; I.V.:2281.2; NG/GT:20; IV Piggyback:997.5] Out: 3765 [Urine:3415; Stool:350]  Vent Mode:  [-] PSV;CPAP FiO2 (%):  [4 %-100 %] 40 % Pressure Support:  [10 cmH20] 10 cmH20   Physical exam: General - no distress HEENT - dry mucosa Cardiac - s1s2 regular, no murmur Chest - scattered rhonchi Abd - soft, nontender Ext - no edema Neuro - Sleepy, easily arousable, conversant, follows commands, moves all extremities  CBC    Component Value Date/Time   WBC 13.3* 07/22/2011 0500   RBC 4.29 07/22/2011 0500   HGB 13.3 07/22/2011 0500   HCT 40.5 07/22/2011 0500   PLT 150 07/22/2011 0500   MCV 94.4 07/22/2011 0500   MCH 31.0 07/22/2011 0500   MCHC 32.8 07/22/2011 0500   RDW 12.5 07/22/2011 0500   LYMPHSABS 1.3 07/20/2011 1150   MONOABS 0.6 07/20/2011 1150   EOSABS 0.0 07/20/2011 1150   BASOSABS 0.0 07/20/2011  1150    BMET    Component Value Date/Time   NA 142 07/22/2011 0500   K 3.4* 07/22/2011 0500   CL 105 07/22/2011 0500   CO2 29 07/22/2011 0500   GLUCOSE 130* 07/22/2011 0500   BUN 14 07/22/2011 0500   CREATININE 1.36* 07/22/2011 0500   CALCIUM 8.0* 07/22/2011 0500   GFRNONAA 66* 07/22/2011 0500   GFRAA 76* 07/22/2011 0500    Lab Results  Component Value Date   ALT 158* 07/22/2011   AST 548* 07/22/2011   ALKPHOS 65 07/22/2011   BILITOT 0.8 07/22/2011   ABG    Component Value Date/Time   PHART 7.464* 07/21/2011 0444   PCO2ART 43.8 07/21/2011 0444   PO2ART 90.0 07/21/2011 0444   HCO3 30.9* 07/21/2011 0444   TCO2 32.2 07/21/2011 0444   ACIDBASEDEF 3.0* 07/20/2011 1858   O2SAT 97.7 07/21/2011 0444    Ct Head Wo Contrast  07/20/2011  *RADIOLOGY REPORT*  Clinical Data: Overdose.  Ventilator support.  CT HEAD WITHOUT CONTRAST  Technique:  Contiguous axial images were obtained from the base of the skull through the vertex without contrast.  Comparison: None.  Findings: The brain has a normal appearance for age.  No evidence of old or acute infarction, mass lesion, hemorrhage, hydrocephalus or extra-axial collection.  Early brain swelling cannot be excluded but is not specifically established.  The calvarium is unremarkable.  No inflammatory sinus disease.  IMPRESSION: Negative head CT.  One could not exclude early generalized brain swelling, but this is not  definitely present and I think the examination is within normal limits for a person of this age.  Original Report Authenticated By: Thomasenia Sales, M.D.   Dg Chest Port 1 View  07/22/2011  *RADIOLOGY REPORT*  Clinical Data: Shortness of breath, follow up pneumonia  PORTABLE CHEST - 1 VIEW  Comparison: 07/21/2011  Findings: Interval extubation and removal of enteric tube.  Left IJ venous catheter with its tip in the left brachiocephalic vein.  Low lung volumes with vascular crowding. Patchy left midlung and right lower lobe opacities, possibly reflecting  pneumonia.  IMPRESSION: Interval extubation.  Low lung volumes.  Patchy left midlung and right lower lobe opacities, possibly reflecting pneumonia.  Original Report Authenticated By: Charline Bills, M.D.   Dg Chest Port 1 View  07/21/2011  *RADIOLOGY REPORT*  Clinical Data: Evaluate endotracheal tube position  PORTABLE CHEST - 1 VIEW  Comparison: 07/20/2011  Findings: Grossly unchanged cardiac silhouette and mediastinal contours.  Stable position of support apparatus.  No pneumothorax. Interval improved aeration of the right mid lung with unchanged left mid and lower lung heterogeneous air space opacities.  Mild poor venous congestion without frank evidence of pulmonary edema. No definite pleural effusion or pneumothorax.  Unchanged bones.  IMPRESSION: 1.  Stable positioning of support apparatus.  No pneumothorax. 2.  No change to minimal improved aeration of the right mid lung with persistent left mid and lower lung heterogeneous air space opacities worrisome for infection/aspiration.  Original Report Authenticated By: Waynard Reeds, M.D.   Dg Chest Portable 1 View  07/20/2011  *RADIOLOGY REPORT*  Clinical Data: Drug overdose.  Line placement and endotracheal tube placement.  PORTABLE CHEST - 1 VIEW  Comparison: Chest radiograph 07/20/2011  Findings: Two images were performed, as the left lung base is excluded from the first image. Image quality slightly degraded by portable technique.  The endotracheal tube is in satisfactory position, terminating approximately 4.8 cm above the carina.  A left IJ central venous catheter terminates in the proximal superior vena cava. A nasogastric tube can be followed into the stomach and continues below the edge of the image.  No evidence of pneumothorax. Heart, mediastinal, and hilar contours appear within normal limits.  There are patchy left perihilar and medial bibasilar opacities.  No visible pleural effusion or pneumothorax.  No acute bony abnormality identified.   IMPRESSION:  1.  Satisfactory position of endotracheal tube. 2.  Left IJ central venous catheter terminates in the proximal superior vena cava.  No evidence of pneumothorax. 3.  Patchy left perihilar and bibasilar opacities.  This could reflect atelectasis and/or airspace disease.  Original Report Authenticated By: Britta Mccreedy, M.D.   Dg Chest Portable 1 View  07/20/2011  *RADIOLOGY REPORT*  Clinical Data: Endotracheal tube placement.  Drug overdose.  PORTABLE CHEST - 1 VIEW  Comparison: No priors.  Findings: The patient is indicated have with the tip of the endotracheal tube approximately 5.6 cm above the carina.  Lung volumes are low.  There are patchy bibasilar opacities (right greater than left), concerning for sequela of aspiration, likely with superimposed atelectasis.  No pleural effusions.  Pulmonary vasculature is normal.  Heart size and mediastinal contours are within normal limits.  IMPRESSION: 1.  Tip of endotracheal tube appears properly located. 2.  Patchy bibasilar opacities favored to represent sequela of aspiration.  Original Report Authenticated By: Florencia Reasons, M.D.    ASSESSMENT/PLAN:  Acute respiratory failure 2nd to change mental status and probable aspiration -successfully extubated 3/14 -D2/x  zosyn; d/c vancomycin 3/15 -f/u CXR intermittently  Acute renal failure likely from volume depletion, rhabdo>>much improved -continue IV fluid -d/c foley -f/u BMET, CPK  Metabolic acidosis -resolved  Metabolic encephalopathy with reported hx of IVDA -resolved  ?Depression -pt declined evaluation by psychiatry at this time  Elevated cardiac enzymes in setting of acute renal failure with acute systolic dysfunction on Echo -Add ASA -will ask cardiology to evaluate  Elevated LFT's -f/u labs -check hepatitis panel  Low K, Mg, Ph -f/u and replace as needed  Hyperglycemia  -SSI -advance diet -check HbA1C  Disposition -Transfer to SDU -d/c foley, and  CVL  Will ask [facility] to assume care from 3/16 and PCCM sign off.  Lisaanne Lawrie Pager:  [phone] 07/22/2011, 8:29 AM

## 2011-07-23 DIAGNOSIS — J690 Pneumonitis due to inhalation of food and vomit: Secondary | ICD-10-CM

## 2011-07-23 DIAGNOSIS — R4182 Altered mental status, unspecified: Secondary | ICD-10-CM

## 2011-07-23 DIAGNOSIS — I219 Acute myocardial infarction, unspecified: Secondary | ICD-10-CM

## 2011-07-23 DIAGNOSIS — J9600 Acute respiratory failure, unspecified whether with hypoxia or hypercapnia: Secondary | ICD-10-CM

## 2011-07-23 DIAGNOSIS — T426X4A Poisoning by other antiepileptic and sedative-hypnotic drugs, undetermined, initial encounter: Secondary | ICD-10-CM

## 2011-07-23 LAB — COMPREHENSIVE METABOLIC PANEL
ALT: 145 U/L — ABNORMAL HIGH (ref 0–53)
CO2: 26 mEq/L (ref 19–32)
Calcium: 8.7 mg/dL (ref 8.4–10.5)
Chloride: 100 mEq/L (ref 96–112)
Creatinine, Ser: 1.03 mg/dL (ref 0.50–1.35)
GFR calc Af Amer: >90 mL/min (ref >90)
GFR calc non Af Amer: >90 mL/min (ref >90)
Glucose, Bld: 98 mg/dL (ref 70–99)
Sodium: 135 mEq/L (ref 135–145)
Total Bilirubin: 0.8 mg/dL (ref 0.3–1.2)

## 2011-07-23 LAB — CK: Total CK: 14117 U/L — ABNORMAL HIGH (ref 7–232)

## 2011-07-23 LAB — CULTURE, RESPIRATORY W GRAM STAIN

## 2011-07-23 LAB — CBC
Hemoglobin: 13.5 g/dL (ref 13.0–17.0)
MCHC: 33.5 g/dL (ref 30.0–36.0)
Platelets: 171 10*3/uL (ref 150–400)

## 2011-07-23 LAB — GLUCOSE, CAPILLARY
Glucose-Capillary: 144 mg/dL — ABNORMAL HIGH (ref 70–99)
Glucose-Capillary: 135 mg/dL — ABNORMAL HIGH (ref 70–99)

## 2011-07-23 LAB — MAGNESIUM: Magnesium: 1.9 mg/dL (ref 1.5–2.5)

## 2011-07-23 MED ORDER — SODIUM CHLORIDE 0.9 % IV SOLN
3.0000 g | Freq: Four times a day (QID) | INTRAVENOUS | Status: DC
  Administered 2011-07-23 – 2011-07-25 (×9): 3 g via INTRAVENOUS
  Filled 2011-07-23 (×11): qty 3

## 2011-07-23 NOTE — Progress Notes (Signed)
ANTIBIOTIC CONSULT NOTE - FOLLOW UP  Pharmacy Consult for Unasyn Indication: aspiration pneumonia coverage  Allergies  Allergen Reactions  . Avelox (Moxifloxacin Hcl In Nacl) Anaphylaxis    Patient Measurements: Height: 5\' 11"  (180.3 cm) Weight: 252 lb 6.8 oz (114.5 kg) IBW/kg (Calculated) : 75.3   Vital Signs: Temp: 98.1 F (36.7 C) (03/16 0600) Temp src: Oral (03/15 2000) BP: 158/96 mmHg (03/16 0600) Pulse Rate: 87  (03/16 0600) Intake/Output from previous day: 03/15 0701 - 03/16 0700 In: 3650 [P.O.:2880; I.V.:670; IV Piggyback:100] Out: 3000 [Urine:3000]   Labs:  [facility] 07/23/11 0501 07/22/11 0500 07/21/11 0500  WBC 12.4* 13.3* 12.9*  HGB 13.5 13.3 14.3  PLT 171 150 152  LABCREA -- -- --  CREATININE 1.03 1.36* 2.11*   Estimated Creatinine Clearance: 127.6 ml/min (by C-G formula based on Cr of 1.03).  Microbiology:   Urine culture negative   MRSA screen negative   3/13 tracheal aspirate culture - non-pathogenic flora to date   3/13 blood cultures -negative to date  Assessment:   Zoysn stopping this morning after 3 days.   Received Vancomycin 3/13-3/15.   Now to change to Unasyn; planned for 5 days.   Renal function back to baseline.  Goal of Therapy:    Appropriate dose for renal function and infection  Plan:    Unasyn 3 grams IV q6 hrs.   Will follow-up final culture data & Unasyn stop date.  Dennie Fetters 07/23/2011,7:32 AM

## 2011-07-23 NOTE — Progress Notes (Signed)
[facility] hospitalists are picking up this patient from PCCM. Dr Vassie Loll has written the note for today.   I have evaluated him as well and he complains of generalized muscle pain to me. He has no other complaints. He is agreeable to getting out of bed to chair today. I have encouraged him to increase PO fluid intake and will be increasing his IVF as well from 50 to 100 cc/hr as his urine is quite dark and his CPK is still significantly elevated. He continues to have good urine outpt which we will cont to follow.   I will order PT eval. We will continue to follow. I have requested to transfer him out of the step down unit and hopefully to 5500 today.   Calvert Cantor, MD [phone]

## 2011-07-23 NOTE — Progress Notes (Signed)
Patient ID: Ronald Greer, male   DOB: 02/26/1976, 36 y.o.   MRN: [id number] @ Subjective:  Sore all over.    Objective:  Filed Vitals:   07/23/11 0815 07/23/11 0900 07/23/11 1000 07/23/11 1100  BP:  143/80    Pulse:  76 78 78  Temp: 98.1 F (36.7 C)     TempSrc: Oral     Resp:  15 17   Height:      Weight:      SpO2:  94% 93% 93%    Intake/Output from previous day:  Intake/Output Summary (Last 24 hours) at 07/23/11 1151 Last data filed at 07/23/11 1100  Gross per 24 hour  Intake 3127.5 ml  Output   3150 ml  Net  -22.5 ml    Physical Exam: Affect appropriate Healthy:  appears stated age HEENT: normal Neck supple with no adenopathy JVP normal no bruits no thyromegaly Lungs clear with no wheezing and good diaphragmatic motion Heart:  S1/S2 no murmur, no rub, gallop or click PMI normal Abdomen: benighn, BS positve, no tenderness, no AAA no bruit.  No HSM or HJR Distal pulses intact with no bruits No edema Neuro non-focal Skin warm and dry tatoos on arms No muscular weakness   Lab Results: Basic Metabolic Panel:  Basename 07/23/11 0501 07/22/11 0500  NA 135 142  K 3.9 3.4*  CL 100 105  CO2 26 29  GLUCOSE 98 130*  BUN 9 14  CREATININE 1.03 1.36*  CALCIUM 8.7 8.0*  MG 1.9 1.5  PHOS 2.3 1.7*   Liver Function Tests:  Basename 07/23/11 0501 07/22/11 0500  AST 366* 548*  ALT 145* 158*  ALKPHOS 72 65  BILITOT 0.8 0.8  PROT 6.7 6.2  ALBUMIN 2.8* 2.7*    Basename 07/20/11 1335  LIPASE 24  AMYLASE 161*   CBC:  Basename 07/23/11 0501 07/22/11 0500  WBC 12.4* 13.3*  NEUTROABS -- --  HGB 13.5 13.3  HCT 40.3 40.5  MCV 92.4 94.4  PLT 171 150   Cardiac Enzymes:  Basename 07/23/11 0501 07/21/11 0137 07/20/11 1937 07/20/11 1334  CKTOTAL 40981* 19147* 82956* --  CKMB -- 56.1* 81.1* 66.3*  CKMBINDEX -- -- -- --  TROPONINI -- 5.19* 6.28* 5.70*   BNP: No components found with this basename: POCBNP:3 D-Dimer: No results found for this basename:  DDIMER:2 in the last 72 hours Hemoglobin A1C:  Basename 07/22/11 0500  HGBA1C 6.9*    Imaging: Dg Chest Port 1 View  07/22/2011  *RADIOLOGY REPORT*  Clinical Data: Shortness of breath, follow up pneumonia  PORTABLE CHEST - 1 VIEW  Comparison: 07/21/2011  Findings: Interval extubation and removal of enteric tube.  Left IJ venous catheter with its tip in the left brachiocephalic vein.  Low lung volumes with vascular crowding. Patchy left midlung and right lower lobe opacities, possibly reflecting pneumonia.  IMPRESSION: Interval extubation.  Low lung volumes.  Patchy left midlung and right lower lobe opacities, possibly reflecting pneumonia.  Original Report Authenticated By: Charline Bills, M.D.    Cardiac Studies:  ECG: NSR normal ECG no acute changes   Telemetry:  NSR no arrhythmia  Echo: EF 45-50% diffuse hypokinesis  Medications:     . ampicillin-sulbactam (UNASYN) IV  3 g Intravenous Q6H  . antiseptic oral rinse  15 mL Mouth Rinse QID  . aspirin  81 mg Oral Daily  . chlorhexidine  15 mL Mouth Rinse BID  . heparin subcutaneous  5,000 Units Subcutaneous Q8H  . metoprolol tartrate  12.5  mg Oral BID  . pantoprazole  40 mg Oral Q1200  . DISCONTD: insulin aspart  0-20 Units Subcutaneous TID WC  . DISCONTD: piperacillin-tazobactam (ZOSYN)  IV  3.375 g Intravenous Q8H       . sodium chloride 50 mL/hr at 07/22/11 1400    Assessment/Plan:  Enzymes:  More consistant with rhabdo.  See note from TS.  When he is ambulatory can consder regular ETT Encephalopathy.  Improving oriented today.  Plan per primary service  CT negative Aspiration:  ? Improving continue antibiotic coverage  Charlton Haws 07/23/2011, 11:51 AM

## 2011-07-23 NOTE — Progress Notes (Signed)
Ronald Greer is a 36 y.o. male smoker admitted on 07/20/2011 with altered mental status, renal failure, elevated cardiac enzymes, elevated LFT's.  No response to narcan in ED.  Intubated for airway protection and concern for aspiration.  Reported hx of IVDA. - he denies all except alcohol & pain meds  Line/tube: ETT 3/13>>3/14 Lt IJ CVL 3/13>>3/15  Cultures: Urine 3/13>>negative MRSA screen 3/13>>negative Blood 3/13>>ng Sputum 3/13>>ng  Antibiotics: Vancomycin 3/13>>3/15 Zosyn 3/13>>3/16  Tests/events: 3/13 CT head>>negative 3/14 Echo>>EF 45% to 50%. Mild diffuse hypokinesis.   SUBJECTIVE:   Denies dyspnea, abd pain.  Has been feeling down recently related to work, but denies suicidal ideation. C/o sore all over, denies drug use except pain meds OBJECTIVE:  Blood pressure 158/96, pulse 87, temperature 98.1 F (36.7 C), temperature source Oral, resp. rate 22, height 5\' 11"  (1.803 m), weight 114.5 kg (252 lb 6.8 oz), SpO2 93.00%. Wt Readings from Last 3 Encounters:  07/22/11 114.5 kg (252 lb 6.8 oz)   Body mass index is 35.21 kg/(m^2).  I/O last 3 completed shifts: In: 4670 [P.O.:3600; I.V.:910; IV Piggyback:160] Out: 4330 [Urine:4330]      Physical exam: General - no distress HEENT - dry mucosa Cardiac - s1s2 regular, no murmur Chest - scattered rhonchi Abd - soft, nontender Ext - no edema Neuro - alert, conversant, follows commands, moves all extremities  CBC    Component Value Date/Time   WBC 12.4* 07/23/2011 0501   RBC 4.36 07/23/2011 0501   HGB 13.5 07/23/2011 0501   HCT 40.3 07/23/2011 0501   PLT 171 07/23/2011 0501   MCV 92.4 07/23/2011 0501   MCH 31.0 07/23/2011 0501   MCHC 33.5 07/23/2011 0501   RDW 12.1 07/23/2011 0501   LYMPHSABS 1.3 07/20/2011 1150   MONOABS 0.6 07/20/2011 1150   EOSABS 0.0 07/20/2011 1150   BASOSABS 0.0 07/20/2011 1150    BMET    Component Value Date/Time   NA 135 07/23/2011 0501   K 3.9 07/23/2011 0501   CL 100 07/23/2011 0501   CO2  26 07/23/2011 0501   GLUCOSE 98 07/23/2011 0501   BUN 9 07/23/2011 0501   CREATININE 1.03 07/23/2011 0501   CALCIUM 8.7 07/23/2011 0501   GFRNONAA >90 07/23/2011 0501   GFRAA >90 07/23/2011 0501    Lab Results  Component Value Date   ALT 145* 07/23/2011   AST 366* 07/23/2011   ALKPHOS 72 07/23/2011   BILITOT 0.8 07/23/2011   ABG    Component Value Date/Time   PHART 7.464* 07/21/2011 0444   PCO2ART 43.8 07/21/2011 0444   PO2ART 90.0 07/21/2011 0444   HCO3 30.9* 07/21/2011 0444   TCO2 32.2 07/21/2011 0444   ACIDBASEDEF 3.0* 07/20/2011 1858   O2SAT 97.7 07/21/2011 0444    Dg Chest Port 1 View  07/22/2011  *RADIOLOGY REPORT*  Clinical Data: Shortness of breath, follow up pneumonia  PORTABLE CHEST - 1 VIEW  Comparison: 07/21/2011  Findings: Interval extubation and removal of enteric tube.  Left IJ venous catheter with its tip in the left brachiocephalic vein.  Low lung volumes with vascular crowding. Patchy left midlung and right lower lobe opacities, possibly reflecting pneumonia.  IMPRESSION: Interval extubation.  Low lung volumes.  Patchy left midlung and right lower lobe opacities, possibly reflecting pneumonia.  Original Report Authenticated By: Charline Bills, M.D.    ASSESSMENT/PLAN:  Acute respiratory failure 2nd to change mental status and probable aspiration -successfully extubated 3/14 -D2/x zosyn; d/c vancomycin 3/15, change to unasyn  x 5ds total -  f/u CXR intermittently  Acute renal failure likely from volume depletion, rhabdo>>much improved -continue IV fluid -d/c foley -f/u BMET, CPK  Metabolic acidosis -resolved  Metabolic encephalopathy with reported hx of IVDA -resolved  ?Depression -pt declined evaluation by psychiatry at this time  Elevated cardiac enzymes in setting of acute renal failure with acute systolic dysfunction on Echo -Add ASA - cardiology 3/15>> reassess when clinically improved, doubt need for angio  Elevated LFT's -resolving -hepatitis panel  neg  Low K, Mg, Ph -f/u and replace as needed  Hyperglycemia  -SSI -advance diet -check HbA1C  Disposition -Transfer to SDU -d/c foley, and CVL  [facility] to assume care from 3/16 and PCCM sign off.Transfer to tele  [facility] V. Pager:  [phone] 07/23/2011, 7:17 AM

## 2011-07-24 ENCOUNTER — Inpatient Hospital Stay (HOSPITAL_COMMUNITY): Payer: BC Managed Care – PPO

## 2011-07-24 LAB — GLUCOSE, CAPILLARY: Glucose-Capillary: 136 mg/dL — ABNORMAL HIGH (ref 70–99)

## 2011-07-24 MED ORDER — CYCLOBENZAPRINE HCL 5 MG PO TABS
5.0000 mg | ORAL_TABLET | Freq: Three times a day (TID) | ORAL | Status: DC
  Administered 2011-07-24 – 2011-07-27 (×11): 5 mg via ORAL
  Filled 2011-07-24 (×13): qty 1

## 2011-07-24 NOTE — Progress Notes (Signed)
Orthopedic Tech Progress Note Patient Details:  Ronald Greer Feb 15, 1976 [id number]  Other Ortho Devices Type of Ortho Device: Other (comment) (sling immobilizer) Ortho Device Location: right arm Ortho Device Interventions: Application   [name] 07/24/2011, 4:56 PM

## 2011-07-24 NOTE — Progress Notes (Signed)
Patient ID: Ronald Greer, male   DOB: 01/04/1976, 36 y.o.   MRN: [id number] @ Subjective:  Sore all over.    Objective:  Filed Vitals:   07/24/11 0444 07/24/11 0600 07/24/11 0756 07/24/11 0855  BP:  165/96  170/89  Pulse:  89  95  Temp:   99.9 F (37.7 C)   TempSrc:   Oral   Resp:  21  18  Height:      Weight: 111.5 kg (245 lb 13 oz)     SpO2:  88%  90%    Intake/Output from previous day:  Intake/Output Summary (Last 24 hours) at 07/24/11 1034 Last data filed at 07/24/11 0956  Gross per 24 hour  Intake 3988.67 ml  Output   4350 ml  Net -361.33 ml    Physical Exam: Affect appropriate Healthy:  appears stated age HEENT: normal Neck supple with no adenopathy JVP normal no bruits no thyromegaly Lungs clear with no wheezing and good diaphragmatic motion Heart:  S1/S2 no murmur, no rub, gallop or click PMI normal Abdomen: benighn, BS positve, no tenderness, no AAA no bruit.  No HSM or HJR Distal pulses intact with no bruits No edema Neuro non-focal Skin warm and dry tatoos on arms No muscular weakness   Lab Results: Basic Metabolic Panel:  Basename 07/23/11 0501 07/22/11 0500  NA 135 142  K 3.9 3.4*  CL 100 105  CO2 26 29  GLUCOSE 98 130*  BUN 9 14  CREATININE 1.03 1.36*  CALCIUM 8.7 8.0*  MG 1.9 1.5  PHOS 2.3 1.7*   Liver Function Tests:  Basename 07/23/11 0501 07/22/11 0500  AST 366* 548*  ALT 145* 158*  ALKPHOS 72 65  BILITOT 0.8 0.8  PROT 6.7 6.2  ALBUMIN 2.8* 2.7*   No results found for this basename: LIPASE:2,AMYLASE:2 in the last 72 hours CBC:  Basename 07/23/11 0501 07/22/11 0500  WBC 12.4* 13.3*  NEUTROABS -- --  HGB 13.5 13.3  HCT 40.3 40.5  MCV 92.4 94.4  PLT 171 150   Cardiac Enzymes:  Basename 07/23/11 0501  CKTOTAL 40981*  CKMB --  CKMBINDEX --  TROPONINI --   BNP: No components found with this basename: POCBNP:3 D-Dimer: No results found for this basename: DDIMER:2 in the last 72 hours Hemoglobin A1C:  Basename  07/22/11 0500  HGBA1C 6.9*    Imaging: No results found.  Cardiac Studies:  ECG: NSR normal ECG no acute changes   Telemetry:  NSR no arrhythmia  Echo: EF 45-50% diffuse hypokinesis  Medications:      . ampicillin-sulbactam (UNASYN) IV  3 g Intravenous Q6H  . antiseptic oral rinse  15 mL Mouth Rinse QID  . aspirin  81 mg Oral Daily  . chlorhexidine  15 mL Mouth Rinse BID  . cyclobenzaprine  5 mg Oral TID  . heparin subcutaneous  5,000 Units Subcutaneous Q8H  . metoprolol tartrate  12.5 mg Oral BID  . pantoprazole  40 mg Oral Q1200        . sodium chloride Stopped (07/24/11 1914)    Assessment/Plan:  Enzymes:  More consistant with rhabdo.  See note from TS.  When he is ambulatory can consder regular ETT Tentatively plan for this on Tuesday ? Transfer out of MICU Encephalopathy.  Improving oriented today.  Plan per primary service  CT negative Aspiration:  ? Improving continue antibiotic coverage  Charlton Haws 07/24/2011, 10:34 AM

## 2011-07-24 NOTE — Consult Note (Signed)
Reason for Consult shoulder pain Referring Physician: MD  Ronald Greer is an 36 y.o. male.  HPI: possible fall right shoulder. Feels like Rc tear like left shoulder was.  Past Medical History  Diagnosis Date  . Intravenous drug abuse, continuous   . Alcohol abuse   . Headache   . No pertinent past medical history     Past Surgical History  Procedure Date  . No past surgeries     Family History  Problem Relation Age of Onset  . Hyperlipidemia Mother   . Hypertension Mother     Social History:  reports that he has been smoking.  He does not have any smokeless tobacco history on file. He reports that he drinks alcohol. He reports that he uses illicit drugs (IV, "Crack" cocaine, and Heroin).  Allergies:  Allergies  Allergen Reactions  . Avelox (Moxifloxacin Hcl In Nacl) Anaphylaxis    Medications: I have reviewed the patient's current medications.  Results for orders placed during the hospital encounter of 07/20/11 (from the past 48 hour(s))  GLUCOSE, CAPILLARY     Status: Abnormal   Collection Time   07/22/11  4:17 PM      Component Value Range Comment   Glucose-Capillary 116 (*) 70 - 99 (mg/dL)   GLUCOSE, CAPILLARY     Status: Abnormal   Collection Time   07/22/11  7:32 PM      Component Value Range Comment   Glucose-Capillary 142 (*) 70 - 99 (mg/dL)   COMPREHENSIVE METABOLIC PANEL     Status: Abnormal   Collection Time   07/23/11  5:01 AM      Component Value Range Comment   Sodium 135  135 - 145 (mEq/L) DELTA CHECK NOTED   Potassium 3.9  3.5 - 5.1 (mEq/L)    Chloride 100  96 - 112 (mEq/L)    CO2 26  19 - 32 (mEq/L)    Glucose, Bld 98  70 - 99 (mg/dL)    BUN 9  6 - 23 (mg/dL)    Creatinine, Ser 9.60  0.50 - 1.35 (mg/dL)    Calcium 8.7  8.4 - 10.5 (mg/dL)    Total Protein 6.7  6.0 - 8.3 (g/dL)    Albumin 2.8 (*) 3.5 - 5.2 (g/dL)    AST 454 (*) 0 - 37 (U/L)    ALT 145 (*) 0 - 53 (U/L)    Alkaline Phosphatase 72  39 - 117 (U/L)    Total Bilirubin 0.8  0.3 - 1.2  (mg/dL)    GFR calc non Af Amer >90  >90 (mL/min)    GFR calc Af Amer >90  >90 (mL/min)   CBC     Status: Abnormal   Collection Time   07/23/11  5:01 AM      Component Value Range Comment   WBC 12.4 (*) 4.0 - 10.5 (K/uL)    RBC 4.36  4.22 - 5.81 (MIL/uL)    Hemoglobin 13.5  13.0 - 17.0 (g/dL)    HCT 09.8  11.9 - 14.7 (%)    MCV 92.4  78.0 - 100.0 (fL)    MCH 31.0  26.0 - 34.0 (pg)    MCHC 33.5  30.0 - 36.0 (g/dL)    RDW 82.9  56.2 - 13.0 (%)    Platelets 171  150 - 400 (K/uL)   CK     Status: Abnormal   Collection Time   07/23/11  5:01 AM      Component Value Range Comment  Total CK 14117 (*) 7 - 232 (U/L)   MAGNESIUM     Status: Normal   Collection Time   07/23/11  5:01 AM      Component Value Range Comment   Magnesium 1.9  1.5 - 2.5 (mg/dL)   PHOSPHORUS     Status: Normal   Collection Time   07/23/11  5:01 AM      Component Value Range Comment   Phosphorus 2.3  2.3 - 4.6 (mg/dL)   GLUCOSE, CAPILLARY     Status: Abnormal   Collection Time   07/23/11  7:08 AM      Component Value Range Comment   Glucose-Capillary 144 (*) 70 - 99 (mg/dL)   GLUCOSE, CAPILLARY     Status: Abnormal   Collection Time   07/23/11 12:13 PM      Component Value Range Comment   Glucose-Capillary 135 (*) 70 - 99 (mg/dL)     Dg Shoulder Right  07/24/2011  *RADIOLOGY REPORT*  Clinical Data: Pain, tenderness  RIGHT SHOULDER - 2+ VIEW  Comparison: None.  Findings: Normal alignment.  Negative for fracture.  Intact clavicle, scapula and proximal humerus.  Right ribs intact.  IMPRESSION: No acute finding  Original Report Authenticated By: Judie Petit. Ruel Favors, M.D.    Review of Systems  Constitutional: Positive for malaise/fatigue.  HENT: Negative.   Eyes: Negative.   Respiratory: Negative.   Cardiovascular: Negative.   Gastrointestinal: Negative.   Genitourinary: Positive for hematuria.  Musculoskeletal: Positive for myalgias.  Skin: Negative.   Neurological: Positive for weakness.    Endo/Heme/Allergies: Negative.    Blood pressure 170/89, pulse 95, temperature 98.2 F (36.8 Greer), temperature source Oral, resp. rate 18, height 5\' 11"  (1.803 m), weight 111.5 kg (245 lb 13 oz), SpO2 90.00%. Physical Exam  Constitutional: He is oriented to person, place, and time. He appears well-developed.  HENT:  Head: Normocephalic.  Eyes: Pupils are equal, round, and reactive to light.  Neck: Normal range of motion.  Cardiovascular: Normal rate.   Respiratory: Effort normal.  GI: Soft.  Musculoskeletal: He exhibits tenderness.       Painful rom right shoulder not severe. NT AC. NVI. Some minor swelling. Copts soft.  Neurological: He is alert and oriented to person, place, and time.  Skin: Skin is warm.  Psychiatric: He has a normal mood and affect.    Assessment/Plan: Right shoulder contusion possible rotator cuff tear. IVDA. Sling, ice. MRI shoulder. Pt wants to do as OPT. See Dr. Shelle Greer in 1-2 weeks. Also consult Chaplain. Discussed with patient. Encouraged pt. [id number].  Ronald Greer 07/24/2011, 3:51 PM

## 2011-07-24 NOTE — Progress Notes (Signed)
Received pt around 1800 from MICU.  Upon reviewing orders, found pt supposed to go to telemetry floor.  Notified MD to clarify.  MD wants patient on telemetry. Tele bed requested.

## 2011-07-24 NOTE — Progress Notes (Signed)
[facility]- Progress note  Interim history: 36 y/o male admittet to ICU on 3/13 after being found unresponsive and obtunded. No response to Narcan. He was intubated for airway protection, started on Vanc and Zosyn and hydrated. He was found to have severe Rhabdomyolysis and elevated Troponins. He was extubated on 3/14 and transferred to [facility] service on 3/16. He is suspected to have a history of Heroin and narcotic abuse.   Consults: [facility]  Subjective: He continues to complain of pain. Today he localizes it to his right  shoulder and states he's having trouble moving the arm. He has had rotator cuff surgeries done in [place].   Objective: Blood pressure 170/89, pulse 95, temperature 99.9 F (37.7 C), temperature source Oral, resp. rate 18, height 5\' 11"  (1.803 m), weight 111.5 kg (245 lb 13 oz), SpO2 90.00%. Weight change:   Intake/Output Summary (Last 24 hours) at 07/24/11 1139 Last data filed at 07/24/11 0956  Gross per 24 hour  Intake 3838.67 ml  Output   4350 ml  Net -511.33 ml    Physical Exam: General appearance: alert, cooperative and no distress Lungs: clear to auscultation bilaterally Heart: regular rate and rhythm, S1, S2 normal, no murmur, click, rub or gallop Abdomen: soft, non-tender; bowel sounds normal; no masses,  no organomegaly Extremities:  no cyanosis or edema, rt shoulder exam reveals a soft tissue mass near the distal end of the clavicle and a firm and tender upper border of trapezius (spasm?). Mild tenderness at the superior aspect of humeral head. He is able to lift the arm above his head level but states this causes pain.   Lab Results:  Basename 07/23/11 0501 07/22/11 0500  NA 135 142  K 3.9 3.4*  CL 100 105  CO2 26 29  GLUCOSE 98 130*  BUN 9 14  CREATININE 1.03 1.36*  CALCIUM 8.7 8.0*  MG 1.9 1.5  PHOS 2.3 1.7*    Basename 07/23/11 0501 07/22/11 0500  AST 366* 548*  ALT 145* 158*  ALKPHOS 72 65  BILITOT 0.8 0.8    PROT 6.7 6.2  ALBUMIN 2.8* 2.7*   No results found for this basename: LIPASE:2,AMYLASE:2 in the last 72 hours  Basename 07/23/11 0501 07/22/11 0500  WBC 12.4* 13.3*  NEUTROABS -- --  HGB 13.5 13.3  HCT 40.3 40.5  MCV 92.4 94.4  PLT 171 150    Basename 07/23/11 0501  CKTOTAL 16109*  CKMB --  CKMBINDEX --  TROPONINI --   No components found with this basename: POCBNP:3 No results found for this basename: DDIMER:2 in the last 72 hours  Basename 07/22/11 0500  HGBA1C 6.9*   No results found for this basename: CHOL:2,HDL:2,LDLCALC:2,TRIG:2,CHOLHDL:2,LDLDIRECT:2 in the last 72 hours No results found for this basename: TSH,T4TOTAL,FREET3,T3FREE,THYROIDAB in the last 72 hours No results found for this basename: VITAMINB12:2,FOLATE:2,FERRITIN:2,TIBC:2,IRON:2,RETICCTPCT:2 in the last 72 hours  Micro Results: Recent Results (from the past 240 hour(s))  URINE CULTURE     Status: Normal   Collection Time   07/20/11  2:18 PM      Component Value Range Status Comment   Specimen Description URINE, RANDOM   Final    Special Requests NONE   Final    Culture  Setup Time [id number]   Final    Colony Count NO GROWTH   Final    Culture NO GROWTH   Final    Report Status 07/21/2011 FINAL   Final   CULTURE, BLOOD (ROUTINE X 2)     Status:  Normal (Preliminary result)   Collection Time   07/20/11  2:30 PM      Component Value Range Status Comment   Specimen Description BLOOD RIGHT HAND   Final    Special Requests BOTTLES DRAWN AEROBIC AND ANAEROBIC 10CC   Final    Culture  Setup Time [id number]   Final    Culture     Final    Value:        BLOOD CULTURE RECEIVED NO GROWTH TO DATE CULTURE WILL BE HELD FOR 5 DAYS BEFORE ISSUING A FINAL NEGATIVE REPORT   Report Status PENDING   Incomplete   CULTURE, BLOOD (ROUTINE X 2)     Status: Normal (Preliminary result)   Collection Time   07/20/11  2:35 PM      Component Value Range Status Comment   Specimen Description BLOOD HAND LEFT   Final     Special Requests     Final    Value: BOTTLES DRAWN AEROBIC AND ANAEROBIC AERO 10CC, ANAE 5CC   Culture  Setup Time [id number]   Final    Culture     Final    Value:        BLOOD CULTURE RECEIVED NO GROWTH TO DATE CULTURE WILL BE HELD FOR 5 DAYS BEFORE ISSUING A FINAL NEGATIVE REPORT   Report Status PENDING   Incomplete   MRSA PCR SCREENING     Status: Normal   Collection Time   07/20/11  3:56 PM      Component Value Range Status Comment   MRSA by PCR NEGATIVE  NEGATIVE  Final   CULTURE, RESPIRATORY     Status: Normal   Collection Time   07/20/11  6:28 PM      Component Value Range Status Comment   Specimen Description ENDOTRACHEAL   Final    Special Requests NONE   Final    Gram Stain     Final    Value: FEW WBC PRESENT,BOTH PMN AND MONONUCLEAR     NO SQUAMOUS EPITHELIAL CELLS SEEN     RARE GRAM POSITIVE COCCI IN PAIRS   Culture Non-Pathogenic Oropharyngeal-type Flora Isolated.   Final    Report Status 07/23/2011 FINAL   Final     Studies/Results: Ct Head Wo Contrast  07/20/2011  *RADIOLOGY REPORT*  Clinical Data: Overdose.  Ventilator support.  CT HEAD WITHOUT CONTRAST  Technique:  Contiguous axial images were obtained from the base of the skull through the vertex without contrast.  Comparison: None.  Findings: The brain has a normal appearance for age.  No evidence of old or acute infarction, mass lesion, hemorrhage, hydrocephalus or extra-axial collection.  Early brain swelling cannot be excluded but is not specifically established.  The calvarium is unremarkable.  No inflammatory sinus disease.  IMPRESSION: Negative head CT.  One could not exclude early generalized brain swelling, but this is not definitely present and I think the examination is within normal limits for a person of this age.  Original Report Authenticated By: Thomasenia Sales, M.D.   Dg Chest Port 1 View  07/22/2011  *RADIOLOGY REPORT*  Clinical Data: Shortness of breath, follow up pneumonia  PORTABLE CHEST - 1 VIEW   Comparison: 07/21/2011  Findings: Interval extubation and removal of enteric tube.  Left IJ venous catheter with its tip in the left brachiocephalic vein.  Low lung volumes with vascular crowding. Patchy left midlung and right lower lobe opacities, possibly reflecting pneumonia.  IMPRESSION: Interval extubation.  Low lung volumes.  Patchy  left midlung and right lower lobe opacities, possibly reflecting pneumonia.  Original Report Authenticated By: Charline Bills, M.D.   Dg Chest Port 1 View  07/21/2011  *RADIOLOGY REPORT*  Clinical Data: Evaluate endotracheal tube position  PORTABLE CHEST - 1 VIEW  Comparison: 07/20/2011  Findings: Grossly unchanged cardiac silhouette and mediastinal contours.  Stable position of support apparatus.  No pneumothorax. Interval improved aeration of the right mid lung with unchanged left mid and lower lung heterogeneous air space opacities.  Mild poor venous congestion without frank evidence of pulmonary edema. No definite pleural effusion or pneumothorax.  Unchanged bones.  IMPRESSION: 1.  Stable positioning of support apparatus.  No pneumothorax. 2.  No change to minimal improved aeration of the right mid lung with persistent left mid and lower lung heterogeneous air space opacities worrisome for infection/aspiration.  Original Report Authenticated By: Waynard Reeds, M.D.   Dg Chest Portable 1 View  07/20/2011  *RADIOLOGY REPORT*  Clinical Data: Drug overdose.  Line placement and endotracheal tube placement.  PORTABLE CHEST - 1 VIEW  Comparison: Chest radiograph 07/20/2011  Findings: Two images were performed, as the left lung base is excluded from the first image. Image quality slightly degraded by portable technique.  The endotracheal tube is in satisfactory position, terminating approximately 4.8 cm above the carina.  A left IJ central venous catheter terminates in the proximal superior vena cava. A nasogastric tube can be followed into the stomach and continues below the  edge of the image.  No evidence of pneumothorax. Heart, mediastinal, and hilar contours appear within normal limits.  There are patchy left perihilar and medial bibasilar opacities.  No visible pleural effusion or pneumothorax.  No acute bony abnormality identified.  IMPRESSION:  1.  Satisfactory position of endotracheal tube. 2.  Left IJ central venous catheter terminates in the proximal superior vena cava.  No evidence of pneumothorax. 3.  Patchy left perihilar and bibasilar opacities.  This could reflect atelectasis and/or airspace disease.  Original Report Authenticated By: Britta Mccreedy, M.D.   Dg Chest Portable 1 View  07/20/2011  *RADIOLOGY REPORT*  Clinical Data: Endotracheal tube placement.  Drug overdose.  PORTABLE CHEST - 1 VIEW  Comparison: No priors.  Findings: The patient is indicated have with the tip of the endotracheal tube approximately 5.6 cm above the carina.  Lung volumes are low.  There are patchy bibasilar opacities (right greater than left), concerning for sequela of aspiration, likely with superimposed atelectasis.  No pleural effusions.  Pulmonary vasculature is normal.  Heart size and mediastinal contours are within normal limits.  IMPRESSION: 1.  Tip of endotracheal tube appears properly located. 2.  Patchy bibasilar opacities favored to represent sequela of aspiration.  Original Report Authenticated By: Florencia Reasons, M.D.    Medications: Scheduled Meds:   . ampicillin-sulbactam (UNASYN) IV  3 g Intravenous Q6H  . antiseptic oral rinse  15 mL Mouth Rinse QID  . aspirin  81 mg Oral Daily  . chlorhexidine  15 mL Mouth Rinse BID  . cyclobenzaprine  5 mg Oral TID  . heparin subcutaneous  5,000 Units Subcutaneous Q8H  . metoprolol tartrate  12.5 mg Oral BID  . pantoprazole  40 mg Oral Q1200   Continuous Infusions:   . sodium chloride Stopped (07/24/11 0922)   PRN Meds:.sodium chloride, oxyCODONE-acetaminophen  Assessment/Plan: Active Problems:  Acute  encephalopathy Question of drug abuse; UDS positive for narcotics but he did not respond to narcan now resolved   Aspiration pneumonia/Sepsis/  Acute respiratory failure Continue Unasyn for total 7 days.    Acute renal failure Due to rhabdo, sepsis, and dehydration Improved  Rhabdomyolysis Cont to hydrate and follow. Likely from being unresponsive on the floor for unknown amount of time vs also from drug use.    Hyperkalemia Possibly due to rhabdo and ARF on admission Now resolved.    Non-ST elevation MI (NSTEMI) Cards following . Possible stress test on Tues  Right shoulder pain Try flexeril for trapezius tenderness- possible a spasm. Not sure if he fell and injured this shoulder.  Will also contact ortho.   PT eval. OOB today- pt hesitant to get oob. Tx to tele floor.    LOS: 4 days   [facility] [phone] 07/24/2011, 11:39 AM

## 2011-07-25 LAB — GLUCOSE, CAPILLARY
Glucose-Capillary: 122 mg/dL — ABNORMAL HIGH (ref 70–99)
Glucose-Capillary: 121 mg/dL — ABNORMAL HIGH (ref 70–99)
Glucose-Capillary: 123 mg/dL — ABNORMAL HIGH (ref 70–99)

## 2011-07-25 LAB — MISCELLANEOUS TEST

## 2011-07-25 MED ORDER — AMLODIPINE BESYLATE 5 MG PO TABS
5.0000 mg | ORAL_TABLET | Freq: Every day | ORAL | Status: DC
  Administered 2011-07-25 – 2011-07-26 (×2): 5 mg via ORAL
  Filled 2011-07-25 (×2): qty 1

## 2011-07-25 MED ORDER — GLUCERNA SHAKE PO LIQD
237.0000 mL | Freq: Three times a day (TID) | ORAL | Status: DC
  Administered 2011-07-25: 237 mL via ORAL

## 2011-07-25 MED ORDER — AMLODIPINE 1 MG/ML ORAL SUSPENSION: 5.0000 mg | Freq: Every day | ORAL | Status: DC

## 2011-07-25 MED ORDER — LIVING WELL WITH DIABETES BOOK
Freq: Once | Status: AC
Start: 2011-07-25 — End: 2011-07-25
  Administered 2011-07-25: 12:00:00
  Filled 2011-07-25 (×2): qty 1

## 2011-07-25 NOTE — Progress Notes (Signed)
    SUBJECTIVE: Sore all over. No chest pain or SOB.   BP 158/94  Pulse 90  Temp(Src) 99.2 F (37.3 C) (Oral)  Resp 18  Ht 5\' 11"  (1.803 m)  Wt 252 lb 3.3 oz (114.4 kg)  BMI 35.18 kg/m2  SpO2 93%  Intake/Output Summary (Last 24 hours) at 07/25/11 7829 Last data filed at 07/25/11 0500  Gross per 24 hour  Intake   3735 ml  Output   2726 ml  Net   1009 ml    PHYSICAL EXAM General: Well developed, well nourished, in no acute distress. Alert and oriented x 3.  Psych:  Good affect, responds appropriately Neck: No JVD. No masses noted.  Lungs: Clear bilaterally with no wheezes or rhonci noted.  Heart: RRR with no murmurs noted. Abdomen: Bowel sounds are present. Soft, non-tender.  Extremities: No lower extremity edema.   LABS: Basic Metabolic Panel:  Basename 07/23/11 0501  NA 135  K 3.9  CL 100  CO2 26  GLUCOSE 98  BUN 9  CREATININE 1.03  CALCIUM 8.7  MG 1.9  PHOS 2.3   CBC:  Basename 07/23/11 0501  WBC 12.4*  NEUTROABS --  HGB 13.5  HCT 40.3  MCV 92.4  PLT 171   Cardiac Enzymes:  Basename 07/23/11 0501  CKTOTAL 56213*  CKMB --  CKMBINDEX --  TROPONINI --    Current Meds:    . ampicillin-sulbactam (UNASYN) IV  3 g Intravenous Q6H  . antiseptic oral rinse  15 mL Mouth Rinse QID  . aspirin  81 mg Oral Daily  . chlorhexidine  15 mL Mouth Rinse BID  . cyclobenzaprine  5 mg Oral TID  . heparin subcutaneous  5,000 Units Subcutaneous Q8H  . metoprolol tartrate  12.5 mg Oral BID  . pantoprazole  40 mg Oral Q1200     ASSESSMENT AND PLAN:  1. Abnormal cardiac enzymes in setting of prolonged time on floor after an event when pt was unconscious, ?etiology. His abnormal cardiac enzymes are most c/w rhabdomyolysis. As per prior plans of cardiology consult, would recommend Exercise treadmill stress test when pt is able to walk to exclude ischemic changes. He will follow up in our office with Dr. Riley Kill after discharge if needed. We will continue to follow  with you here in the hospital.   [facility]  3/18/20138:07 AM

## 2011-07-25 NOTE — Progress Notes (Signed)
Dr. Mahala Menghini notified of patient complaint of nosebleed this am and numbness in right arm(patients middle finger is also a bruised color) new order received to hold 1400 dose of subq. Heparin  Today.

## 2011-07-25 NOTE — Progress Notes (Signed)
PROGRESS NOTE  Ronald Greer ZOX:[id number] DOB: [birth year] DOA: 07/20/2011 PCP: No primary provider on file.  Brief narrative: 36 year old Caucasian male admits to ICU 3/13 after being found unresponsive and obtunded.  Past medical history: Nil  Consultants:  Critical care  Cardiology  Procedures:  Chest x-ray 3/13 = tip of endotracheal tube appears located, patchy bibasilar opacities favored to represent aspiration  Shoulder x-ray 3/17 = no acute finding  Antibiotics:  Zosyn 3/13>> 3/16  Vancomycin 3/13>> 3/15  Unasyn 3/16>>3/18  Augmentin   Subjective  Had an episode of epistaxis and multiple small areas of blood on towel.  Mild chest pain with coughing. Fields ill still and feverish without temperature. No nausea vomiting or   Objective   Interim History: Chart reviewed in entirety  Objective: Filed Vitals:   07/24/11 1801 07/24/11 2100 07/25/11 0500 07/25/11 1013  BP: 161/89 169/95 158/94 152/102  Pulse: 79 89 90 97  Temp: 98.9 F (37.2 C) 99.7 F (37.6 C) 99.2 F (37.3 C)   TempSrc: Oral     Resp: 18 18 18    Height:  5\' 11"  (1.803 m)    Weight:  114.4 kg (252 lb 3.3 oz)    SpO2: 95% 94% 93%     Intake/Output Summary (Last 24 hours) at 07/25/11 1212 Last data filed at 07/25/11 0900  Gross per 24 hour  Intake   2620 ml  Output   2276 ml  Net    344 ml    Exam:  General: Alert oriented Caucasian male, no apparent distress Cardiovascular: S1-S2 no murmurs rubs or gallops. Telemetry: Normal sinus rhythm Respiratory: Clinically clear no added sound Abdomen: Soft nontender numbness in Skin tattoos over her upper extremities and back. No lower symptoms Neuro grossly intact, sensation intact to upper extremities to light touch. Deep tendon reflexes 2 on 3. Power 5 on 5, no facial asymmetry  Data Reviewed: Basic Metabolic Panel:  Lab 07/23/11 8119 07/22/11 0500 07/21/11 0500 07/20/11 1940 07/20/11 1335  NA 135 142 142 140 139  K 3.9 3.4* -- --  --  CL 100 105 103 104 105  CO2 26 29 32 26 23  GLUCOSE 98 130* 163* 180* 130*  BUN 9 14 21 19 17   CREATININE 1.03 1.36* 2.11* 2.60* 3.02*  CALCIUM 8.7 8.0* 7.0* 7.3* 7.1*  MG 1.9 1.5 1.4* -- 1.5  PHOS 2.3 1.7* 1.9* -- 2.5   Liver Function Tests:  Lab 07/23/11 0501 07/22/11 0500 07/20/11 1335  AST 366* 548* 322*  ALT 145* 158* 119*  ALKPHOS 72 65 71  BILITOT 0.8 0.8 0.7  PROT 6.7 6.2 6.6  ALBUMIN 2.8* 2.7* 3.5    Lab 07/20/11 1335  LIPASE 24  AMYLASE 161*   No results found for this basename: AMMONIA:5 in the last 168 hours CBC:  Lab 07/23/11 0501 07/22/11 0500 07/21/11 0500 07/20/11 1335 07/20/11 1150  WBC 12.4* 13.3* 12.9* 25.7* 32.1*  NEUTROABS -- -- -- -- 30.2*  HGB 13.5 13.3 14.3 17.4* 18.7*  HCT 40.3 40.5 43.7 50.5 55.3*  MCV 92.4 94.4 93.4 92.3 93.7  PLT 171 150 152 189 286   Cardiac Enzymes:  Lab 07/23/11 0501 07/21/11 0137 07/20/11 1937 07/20/11 1334  CKTOTAL 14782* 34364* 95621* 20529*  CKMB -- 56.1* 81.1* 66.3*  CKMBINDEX -- -- -- --  TROPONINI -- 5.19* 6.28* 5.70*   BNP: No components found with this basename: POCBNP:5 CBG:  Lab 07/25/11 1158 07/25/11 0729 07/24/11 2057 07/23/11 1213 07/23/11 0708  GLUCAP 121* 122*  136* 135* 144*    Recent Results (from the past 240 hour(s))  URINE CULTURE     Status: Normal   Collection Time   07/20/11  2:18 PM      Component Value Range Status Comment   Specimen Description URINE, RANDOM   Final    Special Requests NONE   Final    Culture  Setup Time [id number]   Final    Colony Count NO GROWTH   Final    Culture NO GROWTH   Final    Report Status 07/21/2011 FINAL   Final   CULTURE, BLOOD (ROUTINE X 2)     Status: Normal (Preliminary result)   Collection Time   07/20/11  2:30 PM      Component Value Range Status Comment   Specimen Description BLOOD RIGHT HAND   Final    Special Requests BOTTLES DRAWN AEROBIC AND ANAEROBIC 10CC   Final    Culture  Setup Time [id number]   Final    Culture     Final      Value:        BLOOD CULTURE RECEIVED NO GROWTH TO DATE CULTURE WILL BE HELD FOR 5 DAYS BEFORE ISSUING A FINAL NEGATIVE REPORT   Report Status PENDING   Incomplete   CULTURE, BLOOD (ROUTINE X 2)     Status: Normal (Preliminary result)   Collection Time   07/20/11  2:35 PM      Component Value Range Status Comment   Specimen Description BLOOD HAND LEFT   Final    Special Requests     Final    Value: BOTTLES DRAWN AEROBIC AND ANAEROBIC AERO 10CC, ANAE 5CC   Culture  Setup Time [id number]   Final    Culture     Final    Value:        BLOOD CULTURE RECEIVED NO GROWTH TO DATE CULTURE WILL BE HELD FOR 5 DAYS BEFORE ISSUING A FINAL NEGATIVE REPORT   Report Status PENDING   Incomplete   MRSA PCR SCREENING     Status: Normal   Collection Time   07/20/11  3:56 PM      Component Value Range Status Comment   MRSA by PCR NEGATIVE  NEGATIVE  Final   CULTURE, RESPIRATORY     Status: Normal   Collection Time   07/20/11  6:28 PM      Component Value Range Status Comment   Specimen Description ENDOTRACHEAL   Final    Special Requests NONE   Final    Gram Stain     Final    Value: FEW WBC PRESENT,BOTH PMN AND MONONUCLEAR     NO SQUAMOUS EPITHELIAL CELLS SEEN     RARE GRAM POSITIVE COCCI IN PAIRS   Culture Non-Pathogenic Oropharyngeal-type Flora Isolated.   Final    Report Status 07/23/2011 FINAL   Final      Studies:              All Imaging reviewed and is as per above notation   Scheduled Meds:   . ampicillin-sulbactam (UNASYN) IV  3 g Intravenous Q6H  . antiseptic oral rinse  15 mL Mouth Rinse QID  . aspirin  81 mg Oral Daily  . chlorhexidine  15 mL Mouth Rinse BID  . cyclobenzaprine  5 mg Oral TID  . heparin subcutaneous  5,000 Units Subcutaneous Q8H  . living well with diabetes book   Does not apply Once  . metoprolol tartrate  12.5  mg Oral BID  . pantoprazole  40 mg Oral Q1200   Continuous Infusions:   . sodium chloride 100 mL/hr at 07/25/11 0618      Assessment/Plan: 1. Encephalopathy-likely related to heroine use. Resolved. 2. Aspiration pneumonia with sepsis and acute respiratory failure-resolved-will switch from Unasyn to Augmentin by mouth. White count now trending downwards. Duration of antibiotics= 7-10 days. Repeat CBC am 3. Non-ST elevation MI-per cardiologist. Still on heparin drip however did have a nosebleed today. Trend CBC.  Likely etiology of elevated enzymes is multifactorial] Milex and acute renal insufficiency-for possible stress test tomorrow a.m. Continue aspirin 81 4. Acute renal failure-likely secondary to to dehydration and sepsis.-Continue IV fluids at current rate. Repeat CK levels in a.m. 5. Elevated blood pressure without diagnosis of hypertension-continue metoprolol 12.5 twice a day-add amlodipine 5 mg daily as not at goal. 6. Right shoulder pain-has some element of radicular pain in his right hand,? Possible median nerve compression secondary to prolonged immobility. Will hold this resolves. If not we'll trial a dose of Neurontin. Neurologically he is intact. Continue Effexor 5, Percocet 5/325 one to 2Q6 when necessary. 7. ? new-onset diabetes-get HbA1c-might need oral medications. Blood sugar is well controlled without meds.  Code Status: Full Family Communication: None at bedside Disposition Plan: Inpatient   Pleas Koch, MD  [facility] Pager [phone] 07/25/2011, 12:12 PM    LOS: 5 days

## 2011-07-25 NOTE — Progress Notes (Signed)
CSW received referral for pt with current substance abuse. Reviewed pt and discussed pt with NCM. Will follow to address substance abuse and provide supportive counseling as appropriate to pt medical progression.  Baxter Flattery, MSW [phone]

## 2011-07-25 NOTE — Plan of Care (Signed)
Problem: Food- and Nutrition-Related Knowledge Deficit (NB-1.1) Goal: Nutrition education Formal process to instruct or train a patient/client in a skill or to impart knowledge to help patients/clients voluntarily manage or modify food choices and eating behavior to maintain or improve health.  Outcome: Completed/Met Date Met:  07/25/11 RD consult for DM/Heart Healthy diet education. Handouts provided from [employer]: Carbohydrate Counting for People with Diabetes and Heart Healthy Nutrition Therapy. Pt opted to review handouts on his own time. RD available should pt have questions regarding diet guidelines/recommendations.  Alger Memos, RD Pager #: [phone]

## 2011-07-25 NOTE — Progress Notes (Signed)
Nutrition Consult/Follow-up  RD consult for DM/Heart Healthy diet education.   Pt extubated 3/14. Pt reports a poor appetite; PO intake at 25% per flowsheet records. RD provided handouts from [employer] -- pt opted to review on his own time. Amenable to Glucerna Shake supplement between meals -- RD to order.  Diet Order:  Carbohydrate Modified Medium Calorie  Meds: Scheduled Meds:   . ampicillin-sulbactam (UNASYN) IV  3 g Intravenous Q6H  . antiseptic oral rinse  15 mL Mouth Rinse QID  . aspirin  81 mg Oral Daily  . chlorhexidine  15 mL Mouth Rinse BID  . cyclobenzaprine  5 mg Oral TID  . heparin subcutaneous  5,000 Units Subcutaneous Q8H  . metoprolol tartrate  12.5 mg Oral BID  . pantoprazole  40 mg Oral Q1200   Continuous Infusions:   . sodium chloride 100 mL/hr at 07/25/11 0618   PRN Meds:.sodium chloride, oxyCODONE-acetaminophen  Labs:  CMP     Component Value Date/Time   NA 135 07/23/2011 0501   K 3.9 07/23/2011 0501   CL 100 07/23/2011 0501   CO2 26 07/23/2011 0501   GLUCOSE 98 07/23/2011 0501   BUN 9 07/23/2011 0501   CREATININE 1.03 07/23/2011 0501   CALCIUM 8.7 07/23/2011 0501   PROT 6.7 07/23/2011 0501   ALBUMIN 2.8* 07/23/2011 0501   AST 366* 07/23/2011 0501   ALT 145* 07/23/2011 0501   ALKPHOS 72 07/23/2011 0501   BILITOT 0.8 07/23/2011 0501   GFRNONAA >90 07/23/2011 0501   GFRAA >90 07/23/2011 0501     Intake/Output Summary (Last 24 hours) at 07/25/11 1142 Last data filed at 07/25/11 0900  Gross per 24 hour  Intake   3200 ml  Output   2676 ml  Net    524 ml    CBG (last 3)   Basename 07/25/11 0729 07/24/11 2057 07/23/11 1213  GLUCAP 122* 136* 135*    Weight Status:  114.4 kg (3/17) -- stable  Re-estimated needs:  2100-2300 kcals, 105-115 gm protein  Nutrition Dx:  Inadequate Oral Intake now r/t poor appetite as evidenced by PO intake 25%, ongoing  Previous Goal:  Per ASPEN guidelines for permissive underfeeding, goal of EN regimen  should not exceed 60-70% of target energy requirements (22-25 kcals/kg IBW, 10-14 kcal actual body weight); protein should be provided at 100% of estimated needs, no longer applicable New Goal: PO intake to meet >90% of estimated nutrition needs, unmet  Intervention:    Add Glucerna Shake PO TID (220 kcals, 9.9 gm protein per 8 fl oz bottle)  RD to follow for nutrition care plan  Monitor:  PO intake, education needs, labs, weight, I/O's   Alger Memos Pager #:  [phone]

## 2011-07-25 NOTE — Progress Notes (Signed)
Physical Therapy Evaluation Patient Details Name: Ronald Greer MRN: [id number] DOB: 1976/04/03 Today's Date: 07/25/2011  Problem List:  Patient Active Problem List  Diagnoses  . Intravenous drug abuse  . Acute encephalopathy  . Aspiration pneumonia  . Sepsis  . Acute respiratory failure  . Acute renal failure  . Hyperkalemia  . Coma  . Dehydration  . Pulmonary infiltrate  . UTI (lower urinary tract infection)  . Non-ST elevation MI (NSTEMI)    Past Medical History:  Past Medical History  Diagnosis Date  . Intravenous drug abuse, continuous   . Alcohol abuse   . Headache   . No pertinent past medical history    Past Surgical History:  Past Surgical History  Procedure Date  . No past surgeries     PT Assessment/Plan/Recommendation PT Assessment Clinical Impression Statement: pt presents with Encephalopathy, s/p fall.  pt generally deconditioned, ataxic gait and decreased balance.  Pending pt's progress pt will need HHPT/OT and 24 hr A.  pt notes that he has friends he can stay with, but unsure how much they can A.  ?if pt would need ST-SNF?   PT Recommendation/Assessment: Patient will need skilled PT in the acute care venue PT Problem List: Decreased strength;Decreased activity tolerance;Decreased balance;Decreased mobility;Decreased coordination;Decreased knowledge of use of DME;Decreased safety awareness;Pain Barriers to Discharge: Decreased caregiver support;Inaccessible home environment Barriers to Discharge Comments: pt lives in [place] townhome with a roommate.  pt notes he can ask friends if he can stay at their place and to provide him A, however unsure level of A friends would be willing to provide.   PT Therapy Diagnosis : Difficulty walking;Generalized weakness;Acute pain PT Plan PT Frequency: Min 3X/week PT Treatment/Interventions: DME instruction;Gait training;Stair training;Functional mobility training;Therapeutic activities;Therapeutic exercise;Balance  training;Patient/family education PT Recommendation Recommendations for Other Services: OT consult Follow Up Recommendations: Home health PT;Supervision/Assistance - 24 hour Equipment Recommended: Rolling walker with 5" wheels PT Goals  Acute Rehab PT Goals PT Goal Formulation: With patient Time For Goal Achievement: 2 weeks Pt will go Supine/Side to Sit: Independently PT Goal: Supine/Side to Sit - Progress: Goal set today Pt will go Sit to Supine/Side: Independently PT Goal: Sit to Supine/Side - Progress: Goal set today Pt will go Sit to Stand: with modified independence PT Goal: Sit to Stand - Progress: Goal set today Pt will go Stand to Sit: with modified independence PT Goal: Stand to Sit - Progress: Goal set today Pt will Ambulate: >150 feet;with supervision;with least restrictive assistive device PT Goal: Ambulate - Progress: Goal set today Pt will Go Up / Down Stairs: 3-5 stairs;with min assist;with rail(s) PT Goal: Up/Down Stairs - Progress: Goal set today  PT Evaluation Precautions/Restrictions  Precautions Precautions: Fall Restrictions Weight Bearing Restrictions: No Prior Functioning  Home Living Lives With:  (Roommate) Receives Help From: Friend(s) Type of Home:  (Townhome) Home Layout: Two level;1/2 bath on main level Alternate Level Stairs-Rails: Right;Left;Can reach both Alternate Level Stairs-Number of Steps: full flight Home Access: Stairs to enter Entrance Stairs-Rails: None Entrance Stairs-Number of Steps: 1 curb Home Adaptive Equipment: None Additional Comments: pt lives with roommate in [place] townhome with bed and full bath upstairs.  pt notes he will ask some friends if he can stay with them in a one-story place.  ?'d pt if he had family to stay with who could provide more A than a friend would and he notes he has friends that can help him.   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.   Cognition Cognition Orientation Level: Oriented X4 Sensation/Coordination Sensation Light Touch: Impaired by gross assessment Additional Comments: pt noting diminished sensation in R hand.   Extremity Assessment RLE Assessment RLE Assessment: Exceptions to [facility] RLE Strength RLE Overall Strength Comments: pt grossly 4+/5, but fatigues quickly LLE Assessment LLE Assessment: Exceptions to [facility] LLE Strength LLE Overall Strength Comments: pt grossly 4+/5, but fatigues quickly Mobility (including Balance) Bed Mobility Bed Mobility: Yes Supine to Sit: 6: Modified independent (Device/Increase time) (increased time.) Sitting - Scoot to Edge of Bed: 6: Modified independent (Device/Increase time) Sit to Supine: 6: Modified independent (Device/Increase time) (Increased time) Transfers Transfers: Yes Sit to Stand: 4: Min assist;With upper extremity assist;From bed Sit to Stand Details (indicate cue type and reason): cues for use of UEs, trunk/hip extension.  pt mildly unsteady and needs to have UE support Stand to Sit: 4: Min assist;With upper extremity assist;To bed Stand to Sit Details: cues to get closer to bed, use of UEs, control descent Ambulation/Gait Ambulation/Gait: Yes Ambulation/Gait Assistance: 4: Min assist;3: Mod assist Ambulation/Gait Assistance Details (indicate cue type and reason): pt unsteady, requires Bil UE support, Min-ModA worse with turns.  pt with 3 LOB requiring PT A to prevent fall.  pt mildly impulsive and with some decreased safety awareness and decreased awareness of deficit.   Ambulation Distance (Feet): 80 Feet Assistive device: Rolling walker Gait Pattern: Step-through pattern;Decreased stride length;Ataxic;Trunk flexed (Moderate Lateral sway.  ) Stairs:  No Wheelchair Mobility Wheelchair Mobility: No  Posture/Postural Control Posture/Postural Control: No significant limitations Balance Balance Assessed: Yes Static Standing Balance Static Standing - Balance Support: Bilateral upper extremity supported Static Standing - Level of Assistance: 5: Stand by assistance Static Standing - Comment/# of Minutes: pt requires Bil UEs supported for safety with standing.  pt needs increased attention to task to maintain balance.   Exercise    End of Session PT - End of Session Equipment Utilized During Treatment: Gait belt Activity Tolerance: Patient tolerated treatment well;Patient limited by fatigue Patient left: in bed;with call bell in reach Nurse Communication: Mobility status for transfers;Mobility status for ambulation General Behavior During Session: [facility] for tasks performed Cognition: [facility] for tasks performed  Sunny Schlein, [place] [phone] 07/25/2011, 11:30 AM

## 2011-07-26 DIAGNOSIS — R748 Abnormal levels of other serum enzymes: Secondary | ICD-10-CM

## 2011-07-26 DIAGNOSIS — I219 Acute myocardial infarction, unspecified: Secondary | ICD-10-CM

## 2011-07-26 LAB — HEMOGLOBIN A1C: Hgb A1c MFr Bld: 7 % — ABNORMAL HIGH (ref <5.7)

## 2011-07-26 LAB — CULTURE, BLOOD (ROUTINE X 2)
Culture  Setup Time: 201303132153
Culture: NO GROWTH

## 2011-07-26 LAB — CARDIAC PANEL(CRET KIN+CKTOT+MB+TROPI)
Total CK: 1382 U/L — ABNORMAL HIGH (ref 7–232)
Troponin I: <0.3 ng/mL (ref <0.30)
Troponin I: <0.3 ng/mL (ref <0.30)

## 2011-07-26 LAB — CBC
HCT: 43.1 % (ref 39.0–52.0)
Hemoglobin: 15.1 g/dL (ref 13.0–17.0)
MCH: 30.9 pg (ref 26.0–34.0)
MCHC: 35 g/dL (ref 30.0–36.0)
MCV: 88.3 fL (ref 78.0–100.0)
RDW: 12.3 % (ref 11.5–15.5)

## 2011-07-26 LAB — GLUCOSE, CAPILLARY: Glucose-Capillary: 116 mg/dL — ABNORMAL HIGH (ref 70–99)

## 2011-07-26 MED ORDER — AMLODIPINE BESYLATE 10 MG PO TABS
10.0000 mg | ORAL_TABLET | Freq: Every day | ORAL | Status: DC
Start: 2011-07-27 — End: 2011-07-27
  Administered 2011-07-27: 10 mg via ORAL
  Filled 2011-07-26: qty 1

## 2011-07-26 MED ORDER — AMOXICILLIN-POT CLAVULANATE 875-125 MG PO TABS
1.0000 | ORAL_TABLET | Freq: Two times a day (BID) | ORAL | Status: DC
  Administered 2011-07-26 – 2011-07-27 (×3): 1 via ORAL
  Filled 2011-07-26 (×4): qty 1

## 2011-07-26 MED ORDER — SODIUM CHLORIDE 0.9 % IV SOLN
INTRAVENOUS | Status: DC
  Administered 2011-07-26: 17:00:00 via INTRAVENOUS

## 2011-07-26 NOTE — Consult Note (Signed)
Reviewed by Sonja Wilson EdD 

## 2011-07-26 NOTE — Progress Notes (Signed)
   CARE MANAGEMENT NOTE 07/26/2011  Patient:  Ronald Greer,Ronald Greer   Account Number:  [id number]  Date Initiated:  07/21/2011  Documentation initiated by:  [facility]  Subjective/Objective Assessment:   found unresponsive - intubated.  drug screen positive for opiods.  Has roomate     Action/Plan:   Anticipated DC Date:  07/27/2011   Anticipated DC Plan:  HOME/SELF CARE  In-house referral  Clinical Social Worker      DC Planning Services  CM consult      Choice offered to / List presented to:             Status of service:  In process, will continue to follow Medicare Important Message given?   (If response is "NO", the following Medicare IM given date fields will be blank) Date Medicare IM given:   Date Additional Medicare IM given:    Discharge Disposition:    Per UR Regulation:  Reviewed for med. necessity/level of care/duration of stay  If discussed at Long Length of Stay Meetings, dates discussed:    Comments:  3/19 spoke w pt. he does not anticipate any dc needs. states doing better. [employer], bsn [phone]

## 2011-07-26 NOTE — Progress Notes (Signed)
Subjective: Brief narrative:  36 year old Caucasian male admits to ICU 3/13 after being found unresponsive and obtunded.  Past medical history:  Nil  Consultants:  Critical care  Cardiology Procedures:  Chest x-ray 3/13 = tip of endotracheal tube appears located, patchy bibasilar opacities favored to represent aspiration  Shoulder x-ray 3/17 = no acute finding Antibiotics:  Zosyn 3/13>> 3/16  Vancomycin 3/13>> 3/15  Unasyn 3/16>>3/18  Augmentin   Objective: Vital signs in last 24 hours: Epistaxis yesterday now resolved chest pain when he coughs Filed Vitals:   07/26/11 0500 07/26/11 0900 07/26/11 1230 07/26/11 1418  BP: 166/102 157/89 153/89 142/77  Pulse: 83 94 93 96  Temp: 98.6 F (37 C) 98.7 F (37.1 C) 98.4 F (36.9 C) 98.3 F (36.8 C)  TempSrc: Oral Oral Oral Oral  Resp: 20 18  18   Height:      Weight: 109 kg (240 lb 4.8 oz)     SpO2: 90% 93% 91% 95%    Intake/Output Summary (Last 24 hours) at 07/26/11 1430 Last data filed at 07/26/11 0900  Gross per 24 hour  Intake    240 ml  Output   1001 ml  Net   -761 ml    Weight change: -5.4 kg (-11 lb 14.5 oz)   General: Alert oriented Caucasian male, no apparent distress  Cardiovascular: S1-S2 no murmurs rubs or gallops. Telemetry: Normal sinus rhythm  Respiratory: Clinically clear no added sound  Abdomen: Soft nontender numbness in  Skin tattoos over her upper extremities and back. No lower symptoms  Neuro grossly intact, sensation intact to upper extremities to light touch.  Deep tendon reflexes 2 on 3. Power 5 on 5, no facial asymmetry  Lab Results: Results for orders placed during the hospital encounter of 07/20/11 (from the past 24 hour(s))  GLUCOSE, CAPILLARY     Status: Abnormal   Collection Time   07/25/11  4:34 PM      Component Value Range   Glucose-Capillary 126 (*) 70 - 99 (mg/dL)   Comment 1 Notify RN    GLUCOSE, CAPILLARY     Status: Abnormal   Collection Time   07/25/11  9:27 PM   Component Value Range   Glucose-Capillary 123 (*) 70 - 99 (mg/dL)  CBC     Status: Abnormal   Collection Time   07/26/11  5:00 AM      Component Value Range   WBC 12.6 (*) 4.0 - 10.5 (K/uL)   RBC 4.88  4.22 - 5.81 (MIL/uL)   Hemoglobin 15.1  13.0 - 17.0 (g/dL)   HCT 16.1  09.6 - 04.5 (%)   MCV 88.3  78.0 - 100.0 (fL)   MCH 30.9  26.0 - 34.0 (pg)   MCHC 35.0  30.0 - 36.0 (g/dL)   RDW 40.9  81.1 - 91.4 (%)   Platelets 280  150 - 400 (K/uL)  CK     Status: Abnormal   Collection Time   07/26/11  5:00 AM      Component Value Range   Total CK 1760 (*) 7 - 232 (U/L)  GLUCOSE, CAPILLARY     Status: Abnormal   Collection Time   07/26/11  7:43 AM      Component Value Range   Glucose-Capillary 106 (*) 70 - 99 (mg/dL)   Comment 1 Notify RN    GLUCOSE, CAPILLARY     Status: Abnormal   Collection Time   07/26/11 11:49 AM      Component Value Range  Glucose-Capillary 116 (*) 70 - 99 (mg/dL)   Comment 1 Notify RN       Micro: Recent Results (from the past 240 hour(s))  URINE CULTURE     Status: Normal   Collection Time   07/20/11  2:18 PM      Component Value Range Status Comment   Specimen Description URINE, RANDOM   Final    Special Requests NONE   Final    Culture  Setup Time [id number]   Final    Colony Count NO GROWTH   Final    Culture NO GROWTH   Final    Report Status 07/21/2011 FINAL   Final   CULTURE, BLOOD (ROUTINE X 2)     Status: Normal   Collection Time   07/20/11  2:30 PM      Component Value Range Status Comment   Specimen Description BLOOD RIGHT HAND   Final    Special Requests BOTTLES DRAWN AEROBIC AND ANAEROBIC 10CC   Final    Culture  Setup Time [id number]   Final    Culture NO GROWTH 5 DAYS   Final    Report Status 07/26/2011 FINAL   Final   CULTURE, BLOOD (ROUTINE X 2)     Status: Normal   Collection Time   07/20/11  2:35 PM      Component Value Range Status Comment   Specimen Description BLOOD HAND LEFT   Final    Special Requests     Final    Value:  BOTTLES DRAWN AEROBIC AND ANAEROBIC AERO 10CC, ANAE 5CC   Culture  Setup Time [id number]   Final    Culture NO GROWTH 5 DAYS   Final    Report Status 07/26/2011 FINAL   Final   MRSA PCR SCREENING     Status: Normal   Collection Time   07/20/11  3:56 PM      Component Value Range Status Comment   MRSA by PCR NEGATIVE  NEGATIVE  Final   CULTURE, RESPIRATORY     Status: Normal   Collection Time   07/20/11  6:28 PM      Component Value Range Status Comment   Specimen Description ENDOTRACHEAL   Final    Special Requests NONE   Final    Gram Stain     Final    Value: FEW WBC PRESENT,BOTH PMN AND MONONUCLEAR     NO SQUAMOUS EPITHELIAL CELLS SEEN     RARE GRAM POSITIVE COCCI IN PAIRS   Culture Non-Pathogenic Oropharyngeal-type Flora Isolated.   Final    Report Status 07/23/2011 FINAL   Final     Studies/Results: No results found.  Medications:  Scheduled Meds:   . amLODipine  10 mg Oral Daily  . aspirin  81 mg Oral Daily  . cyclobenzaprine  5 mg Oral TID  . feeding supplement  237 mL Oral TID BM  . heparin subcutaneous  5,000 Units Subcutaneous Q8H  . metoprolol tartrate  12.5 mg Oral BID  . DISCONTD: amLODipine  5 mg Oral Daily  . DISCONTD: antiseptic oral rinse  15 mL Mouth Rinse QID  . DISCONTD: chlorhexidine  15 mL Mouth Rinse BID   Continuous Infusions:   . sodium chloride 100 mL/hr at 07/26/11 0654   PRN Meds:.sodium chloride, oxyCODONE-acetaminophen   Assessment: Active Problems:  Acute encephalopathy  Aspiration pneumonia  Sepsis  Acute respiratory failure  Acute renal failure  Hyperkalemia  Coma  Dehydration  Pulmonary infiltrate  UTI (lower urinary tract  infection)  Elevation of cardiac enzymes   Plan:  1. Encephalopathy-likely related to heroine use, aspiration. Resolved. 2. Aspiration pneumonia with sepsis and acute respiratory failure-resolved-will switch from Unasyn to Augmentin by mouth. White count now trending downwards. Duration of  antibiotics= 7-10 days. Still has some leukocytosis Non-ST elevation MI-per cardiologist.consider either ETT as outpt when pts strength has improved, or consider pharmacologic study (NUC) while inpt. Continue ASA. With elevated A1c, check lipids. Consider statin as outpt once LFT's normalize. Heparin drip discontinued because of nosebleeds 3.  Trend CBC. Likely etiology of elevated enzymes is multifactorial] Milex and acute renal insufficiency-for possible stress test tomorrow a.m. Continue aspirin 81 4. Acute renal failure-likely secondary to to dehydration , rhabdomyolysis .-Continue IV fluids at current rate. Repeat CK levels in a.m. 5. Elevated blood pressure without diagnosis of hypertension-continue metoprolol 12.5 twice a day- started amlodipine 5 mg daily yesterday. 6. Right shoulder pain-has some element of radicular pain in his right hand,? Possible median nerve compression secondary to prolonged immobility. Will hold this resolves. If not we'll trial a dose of Neurontin. Neurologically he is intact. Continue Effexor 5, Percocet 5/325 one to 2Q6 when necessary. 7. ? new-onset diabetes-get HbA1c-might need oral medications. Blood sugar is well controlled without meds. 8. Epistaxis resolved 9. Disposition Will schedule for inpatient stress test tomorrow  LOS: 6 days   [facility] 07/26/2011, 2:30 PM

## 2011-07-26 NOTE — Progress Notes (Signed)
Results for AUDEN, TATAR (MRN [id number]) as of 07/26/2011 12:43  Ref. Range 07/25/2011 07:29 07/25/2011 11:58 07/25/2011 16:34 07/25/2011 21:27  Glucose-Capillary Latest Range: 70-99 mg/dL 409 (H) 811 (H) 914 (H) 123 (H)   Results for MACALLISTER, ASHMEAD (MRN [id number]) as of 07/26/2011 12:43  Ref. Range 07/22/2011 05:00 07/25/2011 12:44  Hemoglobin A1C Latest Range: <5.7 % 6.9 (H) 7.0 (H)   Noted Dr. Pandora Leiter note from 07/25/11 questioning whether patient has new onset diabetes.  A1C above normal.  Spoke to patient about his elevated A1C.  Explained what an A1C is and what it measures.  Patient told me a physician in the ICU said he had "sugar issues" but no other doctor has said he has diabetes.  Talked to patient about what diabetes is how it affects the body.  Patient told me he has never had sugar issues and that diabetes does not run in his family.  Noted DM educational booklet has been ordered for patient and is currently at bedside.  RNs to provide education at bedside with this patient if this is a definitive diagnosis of diabetes.    MD- Please place order for "Consult Diabetes OP Education" so pt may attend follow-up session with Certified Diabetes Educator after d/c at the [facility].  Will follow. Ambrose Finland RN, MSN, CDE Diabetes Coordinator Inpatient Diabetes Program [phone]

## 2011-07-26 NOTE — Progress Notes (Signed)
Patient Name: Ronald Greer Date of Encounter: 07/26/2011     Active Problems:  Acute encephalopathy  Aspiration pneumonia  Sepsis  Acute respiratory failure  Acute renal failure  Hyperkalemia  Coma  Dehydration  Pulmonary infiltrate  UTI (lower urinary tract infection)  Elevation of cardiac enzymes    SUBJECTIVE  Pt denies chest pain or SOB. He has been up OOB with PT ambulating in halls; notes marked dizziness with activity yesterday which has significantly improved today during ambulation to BR but doesn't think he's ready to walk on a treadmill.  CURRENT MEDS    . amLODipine  5 mg Oral Daily  . aspirin  81 mg Oral Daily  . cyclobenzaprine  5 mg Oral TID  . feeding supplement  237 mL Oral TID BM  . heparin subcutaneous  5,000 Units Subcutaneous Q8H  . living well with diabetes book   Does not apply Once  . metoprolol tartrate  12.5 mg Oral BID    OBJECTIVE  Filed Vitals:   07/25/11 1400 07/25/11 2216 07/26/11 0500 07/26/11 0900  BP: 144/85 168/109 166/102 157/89  Pulse: 82 80 83 94  Temp: 98.2 F (36.8 C) 98.7 F (37.1 C) 98.6 F (37 C) 98.7 F (37.1 C)  TempSrc:  Oral Oral Oral  Resp: 18 20 20 18   Height:      Weight:   240 lb 4.8 oz (109 kg)   SpO2: 92% 94% 90% 93%    Intake/Output Summary (Last 24 hours) at 07/26/11 1003 Last data filed at 07/26/11 0900  Gross per 24 hour  Intake    240 ml  Output   1001 ml  Net   -761 ml   Filed Weights   07/24/11 0444 07/24/11 2100 07/26/11 0500  Weight: 245 lb 13 oz (111.5 kg) 252 lb 3.3 oz (114.4 kg) 240 lb 4.8 oz (109 kg)   PHYSICAL EXAM  General: Pleasant, NAD. Neuro: Alert and oriented X 3. Moves all extremities spontaneously. Psych: Normal affect. HEENT:  Normal.  Neck: Supple without bruits or JVD. Lungs:  Resp regular and unlabored, CTA. Heart: RRR no s3, s4, or murmurs. Abdomen: Soft, non-tender, non-distended, BS + x 4.  Extremities: No clubbing, cyanosis or edema. DP/PT/Radials 2+ and  equal bilaterally.  Accessory Clinical Findings  CBC  Basename 07/26/11 0500  WBC 12.6*  NEUTROABS --  HGB 15.1  HCT 43.1  MCV 88.3  PLT 280   Cardiac Enzymes  Basename 07/26/11 0500  CKTOTAL 1760*  CKMB --  CKMBINDEX --  TROPONINI --   Hemoglobin A1C  Basename 07/25/11 1244  HGBA1C 7.0*   TELE  NSR, rate 80-100.  ASSESSMENT AND PLAN  1. Elevated Cardiac Enzymes: Most consistent with rhabdomyolysis in setting of prolonged period of unconsciousness.  ? NSTEMI type II? No chest pain. Pt worked with PT yesterday and was felt to be unsteady and requiring a walker in the short term.  Pts stability is better today but he still doesn't think he is ready to walk on a treadmill.  Therefore, consider either ETT as outpt when pts strength has improved, or consider pharmacologic study (NUC) while inpt.  Continue ASA.  With elevated A1c, check lipids.  Consider statin as outpt once LFT's normalize.  2. HTN: Trending up.  Amlodipine added to regimen yesterday with continued elevated BP. Titrate norvasc to 10 qd.  Hold off on titration of BB for now as we are considering ETT.  3.  Rhabdomyolysis:  Per IM.  PT seeing.  Strength improving.  4.  ? DM:  A1c 7.0.  BG's mildly elevated.  Defer to IM.   Signed, Nicolasa Ducking NP  Patient stable.  Admits to heroin use with Clayburn Pert).  He will need primary care in order to address some of these issues and this should be arranged prior to dc from the hospital.  I agree with the above recommendations.    Shawnie Pons 1:45 PM 07/26/2011

## 2011-07-27 ENCOUNTER — Inpatient Hospital Stay (HOSPITAL_COMMUNITY): Payer: BC Managed Care – PPO

## 2011-07-27 DIAGNOSIS — R748 Abnormal levels of other serum enzymes: Secondary | ICD-10-CM

## 2011-07-27 DIAGNOSIS — R079 Chest pain, unspecified: Secondary | ICD-10-CM

## 2011-07-27 LAB — CARDIAC PANEL(CRET KIN+CKTOT+MB+TROPI)
CK, MB: 3.4 ng/mL (ref 0.3–4.0)
Relative Index: 0.4 (ref 0.0–2.5)
Troponin I: <0.3 ng/mL (ref <0.30)

## 2011-07-27 LAB — LIPID PANEL
Cholesterol: 183 mg/dL (ref 0–200)
LDL Cholesterol: 113 mg/dL — ABNORMAL HIGH (ref 0–99)
VLDL: 52 mg/dL — ABNORMAL HIGH (ref 0–40)

## 2011-07-27 LAB — GLUCOSE, CAPILLARY

## 2011-07-27 MED ORDER — AMOXICILLIN-POT CLAVULANATE 875-125 MG PO TABS: 1.0000 | ORAL_TABLET | Freq: Two times a day (BID) | ORAL | Status: AC

## 2011-07-27 MED ORDER — TECHNETIUM TC 99M TETROFOSMIN IV KIT
30.0000 | PACK | Freq: Once | INTRAVENOUS | Status: AC | PRN
  Administered 2011-07-27: 30 via INTRAVENOUS

## 2011-07-27 MED ORDER — AMLODIPINE BESYLATE 10 MG PO TABS: 10.0000 mg | ORAL_TABLET | Freq: Every day | ORAL | Status: AC

## 2011-07-27 MED ORDER — REGADENOSON 0.4 MG/5ML IV SOLN
0.4000 mg | Freq: Once | INTRAVENOUS | Status: AC
  Administered 2011-07-27: 0.4 mg via INTRAVENOUS
  Filled 2011-07-27: qty 5

## 2011-07-27 MED ORDER — CYCLOBENZAPRINE HCL 5 MG PO TABS: 5.0000 mg | ORAL_TABLET | Freq: Three times a day (TID) | ORAL | Status: AC | PRN

## 2011-07-27 MED ORDER — OXYCODONE-ACETAMINOPHEN 5-325 MG PO TABS: 1.0000 | ORAL_TABLET | Freq: Four times a day (QID) | ORAL | Status: AC | PRN

## 2011-07-27 MED ORDER — TECHNETIUM TC 99M TETROFOSMIN IV KIT
10.0000 | PACK | Freq: Once | INTRAVENOUS | Status: AC | PRN
  Administered 2011-07-27: 10 via INTRAVENOUS

## 2011-07-27 MED ORDER — ASPIRIN EC 81 MG PO TBEC: 81.0000 mg | DELAYED_RELEASE_TABLET | Freq: Every day | ORAL | Status: AC

## 2011-07-27 MED ORDER — METOPROLOL TARTRATE 12.5 MG HALF TABLET: 12.5000 mg | ORAL_TABLET | Freq: Two times a day (BID) | ORAL | Status: DC

## 2011-07-27 NOTE — Progress Notes (Signed)
PT Cancellation Note  Treatment cancelled today due to patient receiving procedure or test. Patient down for stress test. Will attempt at another time. 07/27/2011 Fredrich Birks PTA [phone] pager [phone] office     Fredrich Birks 07/27/2011, 10:57 AM

## 2011-07-27 NOTE — Consult Note (Signed)
Reason for Consult:Right hand weakness and numbness Referring Physician: Hongalgi  CC: Right hand numbness  HPI: Ronald Greer is an 36 y.o. male who was admitted after being found unresponsive.  It is unclear how long the patient was down but was felt to be prolonged with the patient developing rhabdo asa consequence.  Initially required intubation.  Since extubation has noticed that the first four fingers on the right hand are numb and he is unable to make a strong fist.  This has not improved and consult was called today for further recommendations.    Past Medical History  Diagnosis Date  . Intravenous drug abuse, continuous   . Alcohol abuse   . Headache   . No pertinent past medical history     Past Surgical History  Procedure Date  . No past surgeries     Family History  Problem Relation Age of Onset  . Hyperlipidemia Mother   . Hypertension Mother     Social History:  reports that he has been smoking.  He does not have any smokeless tobacco history on file. He reports that he drinks alcohol. He reports that he uses illicit drugs (IV, "Crack" cocaine, and Heroin).  Allergies  Allergen Reactions  . Avelox (Moxifloxacin Hcl In Nacl) Anaphylaxis    Medications:  I have reviewed the patient's current medications. Prior to Admission:  Prescriptions prior to admission  Medication Sig Dispense Refill  . Multiple Vitamins-Minerals (MULTIVITAMINS THER. W/MINERALS) TABS Take 1 tablet by mouth daily.       Scheduled:   . amLODipine  10 mg Oral Daily  . amoxicillin-clavulanate  1 tablet Oral Q12H  . aspirin  81 mg Oral Daily  . cyclobenzaprine  5 mg Oral TID  . feeding supplement  237 mL Oral TID BM  . heparin subcutaneous  5,000 Units Subcutaneous Q8H  . metoprolol tartrate  12.5 mg Oral BID  . regadenoson  0.4 mg Intravenous Once    ROS: History obtained from the patient  General ROS: negative for - chills, fatigue, fever, night sweats, weight gain or weight  loss Psychological ROS: negative for - behavioral disorder, hallucinations, memory difficulties, mood swings or suicidal ideation Ophthalmic ROS: negative for - blurry vision, double vision, eye pain or loss of vision ENT ROS: negative for - epistaxis, nasal discharge, oral lesions, sore throat, tinnitus or vertigo Allergy and Immunology ROS: negative for - hives or itchy/watery eyes Hematological and Lymphatic ROS: negative for - bleeding problems, bruising or swollen lymph nodes Endocrine ROS: negative for - galactorrhea, hair pattern changes, polydipsia/polyuria or temperature intolerance Respiratory ROS: negative for - cough, hemoptysis, shortness of breath or wheezing Cardiovascular ROS: negative for - chest pain, dyspnea on exertion, edema or irregular heartbeat Gastrointestinal ROS: negative for - abdominal pain, diarrhea, hematemesis, nausea/vomiting or stool incontinence Genito-Urinary ROS: negative for - dysuria, hematuria, incontinence or urinary frequency/urgency Musculoskeletal ROS: right shoulder pain, low back pain Neurological ROS: as noted in HPI Dermatological ROS: negative for rash and skin lesion changes  Physical Examination: Blood pressure 144/73, pulse 65, temperature 97.4 F (36.3 C), temperature source Oral, resp. rate 20, height 5\' 11"  (1.803 m), weight 108.6 kg (239 lb 6.7 oz), SpO2 93.00%.  Neurologic Examination Mental Status: Alert, oriented, thought content appropriate.  Speech fluent without evidence of aphasia.  Able to follow 3 step commands without difficulty. Cranial Nerves: II: visual fields grossly normal, pupils equal, round, reactive to light and accommodation III,IV, VI: ptosis not present, extra-ocular motions intact bilaterally V,VII:  smile symmetric, facial light touch sensation normal bilaterally VIII: hearing normal bilaterally IX,X: gag reflex present XI: trapezius strength/neck flexion strength normal bilaterally XII: tongue strength normal   Motor: Right : Upper extremity    Left:     Upper extremity  deltoid-weakness secondary to pain   5/5 deltoid 5/5 biceps      5/5 biceps  5/5 triceps      5/5 triceps 5-/5wrist flexion     5/5 wrist flexion 5-/5 wrist extension     5/5 wrist extension 4/5 hand grip      5/5 hand grip  Lower extremity     Lower extremity 5/5 hip flexor      5/5 hip flexor 5/5 hip adductors     5/5 hip adductors 5/5 hip abductors     5/5 hip abductors 5/5 quadricep      5/5 quadriceps  5/5 hamstrings     5/5 hamstrings 5/5 plantar flexion       5/5 plantar flexion 5/5 plantar extension     5/5 plantar extension Weak forefinger-thumb and middle finger-thumb opposition on the right.  Most of the strength form the hand grip given by the last two fingers.  Tone and bulk:normal tone throughout; no atrophy noted Sensory: Pinprick and light touch decreased on the lateral aspect of the palm and in the first 3 fingers and the lateral aspect of the fourth finger on the right.  Last finger without any sensory disturbance.   Deep Tendon Reflexes: 2+ in the LUE and at the right triceps.  Absent right biceps jerk and trace right brachioradialis   Results for orders placed during the hospital encounter of 07/20/11 (from the past 48 hour(s))  GLUCOSE, CAPILLARY     Status: Abnormal   Collection Time   07/25/11  4:34 PM      Component Value Range Comment   Glucose-Capillary 126 (*) 70 - 99 (mg/dL)    Comment 1 Notify RN     GLUCOSE, CAPILLARY     Status: Abnormal   Collection Time   07/25/11  9:27 PM      Component Value Range Comment   Glucose-Capillary 123 (*) 70 - 99 (mg/dL)   CBC     Status: Abnormal   Collection Time   07/26/11  5:00 AM      Component Value Range Comment   WBC 12.6 (*) 4.0 - 10.5 (K/uL)    RBC 4.88  4.22 - 5.81 (MIL/uL)    Hemoglobin 15.1  13.0 - 17.0 (g/dL)    HCT 08.6  57.8 - 46.9 (%)    MCV 88.3  78.0 - 100.0 (fL)    MCH 30.9  26.0 - 34.0 (pg)    MCHC 35.0  30.0 - 36.0 (g/dL)    RDW  62.9  52.8 - 41.3 (%)    Platelets 280  150 - 400 (K/uL)   CK     Status: Abnormal   Collection Time   07/26/11  5:00 AM      Component Value Range Comment   Total CK 1760 (*) 7 - 232 (U/L)   GLUCOSE, CAPILLARY     Status: Abnormal   Collection Time   07/26/11  7:43 AM      Component Value Range Comment   Glucose-Capillary 106 (*) 70 - 99 (mg/dL)    Comment 1 Notify RN     GLUCOSE, CAPILLARY     Status: Abnormal   Collection Time   07/26/11 11:49 AM  Component Value Range Comment   Glucose-Capillary 116 (*) 70 - 99 (mg/dL)    Comment 1 Notify RN     CARDIAC PANEL(CRET KIN+CKTOT+MB+TROPI)     Status: Abnormal   Collection Time   07/26/11  4:46 PM      Component Value Range Comment   Total CK 1382 (*) 7 - 232 (U/L)    CK, MB 3.2  0.3 - 4.0 (ng/mL)    Troponin I <0.30  <0.30 (ng/mL)    Relative Index 0.2  0.0 - 2.5    GLUCOSE, CAPILLARY     Status: Normal   Collection Time   07/26/11  4:53 PM      Component Value Range Comment   Glucose-Capillary 94  70 - 99 (mg/dL)   GLUCOSE, CAPILLARY     Status: Abnormal   Collection Time   07/26/11  9:28 PM      Component Value Range Comment   Glucose-Capillary 108 (*) 70 - 99 (mg/dL)   CARDIAC PANEL(CRET KIN+CKTOT+MB+TROPI)     Status: Abnormal   Collection Time   07/26/11 10:30 PM      Component Value Range Comment   Total CK 1056 (*) 7 - 232 (U/L)    CK, MB 3.1  0.3 - 4.0 (ng/mL)    Troponin I <0.30  <0.30 (ng/mL)    Relative Index 0.3  0.0 - 2.5    CARDIAC PANEL(CRET KIN+CKTOT+MB+TROPI)     Status: Abnormal   Collection Time   07/27/11  5:10 AM      Component Value Range Comment   Total CK 880 (*) 7 - 232 (U/L)    CK, MB 3.4  0.3 - 4.0 (ng/mL)    Troponin I <0.30  <0.30 (ng/mL)    Relative Index 0.4  0.0 - 2.5    LIPID PANEL     Status: Abnormal   Collection Time   07/27/11  5:10 AM      Component Value Range Comment   Cholesterol 183  0 - 200 (mg/dL)    Triglycerides 161 (*) <150 (mg/dL)    HDL 18 (*) >09 (mg/dL)    Total  CHOL/HDL Ratio 10.2      VLDL 52 (*) 0 - 40 (mg/dL)    LDL Cholesterol 604 (*) 0 - 99 (mg/dL)   GLUCOSE, CAPILLARY     Status: Abnormal   Collection Time   07/27/11  7:47 AM      Component Value Range Comment   Glucose-Capillary 123 (*) 70 - 99 (mg/dL)    Comment 1 Notify RN     GLUCOSE, CAPILLARY     Status: Abnormal   Collection Time   07/27/11 12:28 PM      Component Value Range Comment   Glucose-Capillary 142 (*) 70 - 99 (mg/dL)    Comment 1 Notify RN       Recent Results (from the past 240 hour(s))  URINE CULTURE     Status: Normal   Collection Time   07/20/11  2:18 PM      Component Value Range Status Comment   Specimen Description URINE, RANDOM   Final    Special Requests NONE   Final    Culture  Setup Time [id number]   Final    Colony Count NO GROWTH   Final    Culture NO GROWTH   Final    Report Status 07/21/2011 FINAL   Final   CULTURE, BLOOD (ROUTINE X 2)     Status: Normal  Collection Time   07/20/11  2:30 PM      Component Value Range Status Comment   Specimen Description BLOOD RIGHT HAND   Final    Special Requests BOTTLES DRAWN AEROBIC AND ANAEROBIC 10CC   Final    Culture  Setup Time [id number]   Final    Culture NO GROWTH 5 DAYS   Final    Report Status 07/26/2011 FINAL   Final   CULTURE, BLOOD (ROUTINE X 2)     Status: Normal   Collection Time   07/20/11  2:35 PM      Component Value Range Status Comment   Specimen Description BLOOD HAND LEFT   Final    Special Requests     Final    Value: BOTTLES DRAWN AEROBIC AND ANAEROBIC AERO 10CC, ANAE 5CC   Culture  Setup Time [id number]   Final    Culture NO GROWTH 5 DAYS   Final    Report Status 07/26/2011 FINAL   Final   MRSA PCR SCREENING     Status: Normal   Collection Time   07/20/11  3:56 PM      Component Value Range Status Comment   MRSA by PCR NEGATIVE  NEGATIVE  Final   CULTURE, RESPIRATORY     Status: Normal   Collection Time   07/20/11  6:28 PM      Component Value Range Status Comment    Specimen Description ENDOTRACHEAL   Final    Special Requests NONE   Final    Gram Stain     Final    Value: FEW WBC PRESENT,BOTH PMN AND MONONUCLEAR     NO SQUAMOUS EPITHELIAL CELLS SEEN     RARE GRAM POSITIVE COCCI IN PAIRS   Culture Non-Pathogenic Oropharyngeal-type Flora Isolated.   Final    Report Status 07/23/2011 FINAL   Final     No results found.   Assessment/Plan:  Patient Active Hospital Problem List: Right hand numbness and weakness (07/20/2011)   Assessment: With absent DTR's seen on exam along with numbness, concerned about a cervical radiculopathy, C5-6, C6-7.  Patient also complaining of some neck pain.  Weakness may very well be related to the degree of numbness that the patient is appreciating.     Plan: 1. MRI of the cervical spine-although advised to be performed while hospitalized, the patient feels the need to be discharged today and wants this to be scheduled as an outpatient.  This was discussed with Dr. Waymon Amato.  Patient will need f/u with neurology as an outpatient as well.       Thana Farr, MD [facility] [phone] 07/27/2011, 2:30 PM

## 2011-07-27 NOTE — Progress Notes (Addendum)
Subjective:   Doing well.  Sore overall but numbers improving.   Objective:  Vital Signs in the last 24 hours: Temp:  [97.9 F (36.6 C)-98.4 F (36.9 C)] 98.1 F (36.7 C) (03/20 0500) Pulse Rate:  [76-106] 91  (03/20 1025) Resp:  [18-20] 20  (03/20 0500) BP: (114-160)/(77-93) 160/88 mmHg (03/20 1025) SpO2:  [91 %-96 %] 96 % (03/20 0500) Weight:  [239 lb 6.7 oz (108.6 kg)] 239 lb 6.7 oz (108.6 kg) (03/20 0500)  Intake/Output from previous day: 03/19 0701 - 03/20 0700 In: 240 [P.O.:240] Out: -    Physical Exam: General: Well developed, well nourished, in no acute distress. Head:  Normocephalic and atraumatic. Lungs: Clear to auscultation and percussion. Heart: Normal S1 and S2.  No murmur, rubs or gallops.  Pulses: Pulses normal in all 4 extremities. Extremities: No clubbing or cyanosis. No edema. Neurologic: Alert and oriented x 3.    Lab Results:  Basename 07/26/11 0500  WBC 12.6*  HGB 15.1  PLT 280   No results found for this basename: NA:2,K:2,CL:2,CO2:2,GLUCOSE:2,BUN:2,CREATININE:2 in the last 72 hours  Basename 07/27/11 0510 07/26/11 2230  TROPONINI <0.30 <0.30   Hepatic Function Panel No results found for this basename: PROT,ALBUMIN,AST,ALT,ALKPHOS,BILITOT,BILIDIR,IBILI in the last 72 hours  Basename 07/27/11 0510  CHOL 183   No results found for this basename: PROTIME in the last 72 hours  Imaging: No results found.    Assessment/Plan:   Elevation of cardiac enzymes (07/26/2011)   Assessment: doubt these were of clinical significance after story of heroin use revealed.   Plan: Lexiscan ordered by primary team and pending.   Will order repeat baseline ECG so we have.  Did have rightward axis. Will need repeat echocardiogram as OP to ensure that LV is recovered.  It was likely an acute illness finding.         Shawnie Pons, MD, [facility], [place], FSCAI 07/27/2011, 11:34 AM

## 2011-07-27 NOTE — Discharge Summary (Addendum)
Discharge Summary  Ronald Greer MR#: [id number]  DOB:03-Nov-1975  Date of Admission: 07/20/2011 Date of Discharge: 07/27/2011  Patient's PCP: No primary provider on file.  Attending Physician:Millan Legan  Consults: 1. Cardiology: Dr. Shawnie Pons 2. Neurology: Dr. Thana Farr 3. Orthopedics: Dr. Jene Every 4. Pulmonary critical care.    Discharge Diagnoses: Active Problems:  Acute encephalopathy  Aspiration pneumonia  Sepsis  Acute respiratory failure  Acute renal failure  Hyperkalemia  Coma  Dehydration  Pulmonary infiltrate  UTI (lower urinary tract infection)  Elevation of cardiac enzymes Right shoulder contusion possible rotator cuff tear. IV drug abuse Right hand the numbness and weakness, concern for cervical radiculopathy at C5-6 and C6-7: For outpatient evaluation Newly diagnosed type 2 diabetes mellitus  Brief Admitting History and Physical 36 year old white male, with reported h/o heroine use, presented to the ER on 3/13 after being found by friend unresponsive. On EMS arrival he was obtunded, he began to cough and gag after narcan administration but did not wake up. He was intubated by EDP for airway protection, PCCM asked to admit.  Discharge Medications Current Discharge Medication List    START taking these medications   Details  amLODipine (NORVASC) 10 MG tablet Take 1 tablet (10 mg total) by mouth daily. Qty: 30 tablet, Refills: 0    amoxicillin-clavulanate (AUGMENTIN) 875-125 MG per tablet Take 1 tablet by mouth every 12 (twelve) hours. Qty: 6 tablet, Refills: 0    aspirin EC 81 MG tablet Take 1 tablet (81 mg total) by mouth daily.    cyclobenzaprine (FLEXERIL) 5 MG tablet Take 1 tablet (5 mg total) by mouth 3 (three) times daily as needed for muscle spasms. Qty: 30 tablet, Refills: 0    metoprolol tartrate (LOPRESSOR) 12.5 mg TABS Take 0.5 tablets (12.5 mg total) by mouth 2 (two) times daily. Qty: 30 tablet, Refills: 0      oxyCODONE-acetaminophen (PERCOCET) 5-325 MG per tablet Take 1 tablet by mouth every 6 (six) hours as needed for pain. Qty: 20 tablet, Refills: 0      CONTINUE these medications which have NOT CHANGED   Details  Multiple Vitamins-Minerals (MULTIVITAMINS THER. W/MINERALS) TABS Take 1 tablet by mouth daily.        Hospital Course: <principal problem not specified> Present on Admission:  .Acute encephalopathy .Sepsis .Non-ST elevation MI (NSTEMI)   1. Acute respiratory failure secondary to encephalopathy and possible aspiration: Patient was admitted to the ICU by the critical care team. He required mechanical ventilation and was treated with empiric antibiotics. He was extubated days ago and his respiratory status is stable. He will complete total 10 days course of antibiotics. 2. Aspiration pneumonia: Possibly secondary to his encephalopathy and up-to-date state. Management as above. 3. Acute renal failure: Secondary to volume depletion and rhabdomyolysis. Resolved. 4. Metabolic acidosis: Secondary to problem #3. Resolved. 5. Toxic metabolic encephalopathy with reported history of IV drug abuse: Resolved. 6. Elevated cardiac enzymes: Cardiology was consulted and doubt these are of clinical significance in the context of heroine use. Discussed Lexi scan results with cardiology who indicated that it is a low risk result and patient can be followed outpatient with repeat 2-D echocardiogram which had shown reduced EF. Continue low dose aspirin and low-dose beta blockers. 7. Hypokalemia, hypomagnesemia and hypophosphatemia: Repleted 8. IV drug abuse/heroine and narcotic abuse: Patient has been counseled regarding cessation. 9. Right shoulder contusion and possible rotator cuff tear: Patient was seen by orthopedics and patient prefers to do MRI of the shoulder as an  outpatient. He is to see Dr. Yolonda Kida in one to 2 weeks from hospital discharge. 10. Rhabdomyolysis:  Improved 11. Hypertension: Continue low dose beta blocker and amlodipine. 12. Right hand the numbness and weakness, concern for cervical radiculopathy at C5-6 and C6-7: Neurology was consulted and recommended MRI of the cervical spine while hospitalized but patient declined this and wants this to be scheduled as an outpatient. He's been advised to followup with his primary care physician regarding this and eventually follow up with neurology as an outpatient. His primary care physician will have to coordinate this. 13. Newly diagnosed type 2 diabetes mellitus: Advised that management and weight loss and followup with primary care physician.  Day of Discharge BP 144/73  Pulse 65  Temp(Src) 97.4 F (36.3 C) (Oral)  Resp 20  Ht 5\' 11"  (1.803 m)  Wt 108.6 kg (239 lb 6.7 oz)  BMI 33.39 kg/m2  SpO2 93%  General exam: Comfortable. No distress. Respiratory system: Clear. Cardiovascular system: First and second heart sounds heard, regular. No JVD or carotid bruit or pedal edema. Telemetry shows sinus rhythm. Gastrointestinal system: Abdomen is nondistended, soft and normal bowel sounds heard. Nontender. Central nervous system: Alert and oriented. No focal neurological deficits. Extremities: Right hand grip grade 4 x 5. Reduced sensation in the lateral 3-1/2 finger area going up to the wrist.   Results for orders placed during the hospital encounter of 07/20/11 (from the past 48 hour(s))  GLUCOSE, CAPILLARY     Status: Abnormal   Collection Time   07/25/11  9:27 PM      Component Value Range Comment   Glucose-Capillary 123 (*) 70 - 99 (mg/dL)   CBC     Status: Abnormal   Collection Time   07/26/11  5:00 AM      Component Value Range Comment   WBC 12.6 (*) 4.0 - 10.5 (K/uL)    RBC 4.88  4.22 - 5.81 (MIL/uL)    Hemoglobin 15.1  13.0 - 17.0 (g/dL)    HCT 96.0  45.4 - 09.8 (%)    MCV 88.3  78.0 - 100.0 (fL)    MCH 30.9  26.0 - 34.0 (pg)    MCHC 35.0  30.0 - 36.0 (g/dL)    RDW 11.9  14.7 -  82.9 (%)    Platelets 280  150 - 400 (K/uL)   CK     Status: Abnormal   Collection Time   07/26/11  5:00 AM      Component Value Range Comment   Total CK 1760 (*) 7 - 232 (U/L)   GLUCOSE, CAPILLARY     Status: Abnormal   Collection Time   07/26/11  7:43 AM      Component Value Range Comment   Glucose-Capillary 106 (*) 70 - 99 (mg/dL)    Comment 1 Notify RN     GLUCOSE, CAPILLARY     Status: Abnormal   Collection Time   07/26/11 11:49 AM      Component Value Range Comment   Glucose-Capillary 116 (*) 70 - 99 (mg/dL)    Comment 1 Notify RN     CARDIAC PANEL(CRET KIN+CKTOT+MB+TROPI)     Status: Abnormal   Collection Time   07/26/11  4:46 PM      Component Value Range Comment   Total CK 1382 (*) 7 - 232 (U/L)    CK, MB 3.2  0.3 - 4.0 (ng/mL)    Troponin I <0.30  <0.30 (ng/mL)    Relative Index 0.2  0.0 - 2.5    GLUCOSE, CAPILLARY     Status: Normal   Collection Time   07/26/11  4:53 PM      Component Value Range Comment   Glucose-Capillary 94  70 - 99 (mg/dL)   GLUCOSE, CAPILLARY     Status: Abnormal   Collection Time   07/26/11  9:28 PM      Component Value Range Comment   Glucose-Capillary 108 (*) 70 - 99 (mg/dL)   CARDIAC PANEL(CRET KIN+CKTOT+MB+TROPI)     Status: Abnormal   Collection Time   07/26/11 10:30 PM      Component Value Range Comment   Total CK 1056 (*) 7 - 232 (U/L)    CK, MB 3.1  0.3 - 4.0 (ng/mL)    Troponin I <0.30  <0.30 (ng/mL)    Relative Index 0.3  0.0 - 2.5    CARDIAC PANEL(CRET KIN+CKTOT+MB+TROPI)     Status: Abnormal   Collection Time   07/27/11  5:10 AM      Component Value Range Comment   Total CK 880 (*) 7 - 232 (U/L)    CK, MB 3.4  0.3 - 4.0 (ng/mL)    Troponin I <0.30  <0.30 (ng/mL)    Relative Index 0.4  0.0 - 2.5    LIPID PANEL     Status: Abnormal   Collection Time   07/27/11  5:10 AM      Component Value Range Comment   Cholesterol 183  0 - 200 (mg/dL)    Triglycerides 409 (*) <150 (mg/dL)    HDL 18 (*) >81 (mg/dL)    Total CHOL/HDL  Ratio 10.2      VLDL 52 (*) 0 - 40 (mg/dL)    LDL Cholesterol 191 (*) 0 - 99 (mg/dL)   GLUCOSE, CAPILLARY     Status: Abnormal   Collection Time   07/27/11  7:47 AM      Component Value Range Comment   Glucose-Capillary 123 (*) 70 - 99 (mg/dL)    Comment 1 Notify RN     GLUCOSE, CAPILLARY     Status: Abnormal   Collection Time   07/27/11 12:28 PM      Component Value Range Comment   Glucose-Capillary 142 (*) 70 - 99 (mg/dL)    Comment 1 Notify RN      Lab results: 1. CBGs range between 94-142 mg/dL. 2. Cardiac enzymes cycled x3 in the last 24 hours: Troponins were negative. CK range between [phone]. The total CK #gradually decreasing. 3. LFTs on March 16 showed AST of 366, ALT of 145, possibly secondary to rhabdomyolysis. 4. Urine negative for Legionella antigen and Streptococcus pneumonia antigen. 5. Urine drug screen was positive for benzodiazepines and opiates. 6. Urine culture, blood cultures and respiratory cultures were unremarkable.   Dg Shoulder Right  07/24/2011  *RADIOLOGY REPORT*  Clinical Data: Pain, tenderness  RIGHT SHOULDER - 2+ VIEW  Comparison: None.  Findings: Normal alignment.  Negative for fracture.  Intact clavicle, scapula and proximal humerus.  Right ribs intact.  IMPRESSION: No acute finding  Original Report Authenticated By: Judie Petit. Ruel Favors, M.D.   Ct Head Wo Contrast  07/20/2011  *RADIOLOGY REPORT*  Clinical Data: Overdose.  Ventilator support.  CT HEAD WITHOUT CONTRAST  Technique:  Contiguous axial images were obtained from the base of the skull through the vertex without contrast.  Comparison: None.  Findings: The brain has a normal appearance for age.  No evidence of old or acute infarction, mass lesion,  hemorrhage, hydrocephalus or extra-axial collection.  Early brain swelling cannot be excluded but is not specifically established.  The calvarium is unremarkable.  No inflammatory sinus disease.  IMPRESSION: Negative head CT.  One could not exclude early  generalized brain swelling, but this is not definitely present and I think the examination is within normal limits for a person of this age.  Original Report Authenticated By: Thomasenia Sales, M.D.   Nm Myocar Multi W/spect W/wall Motion / Ef  07/27/2011  Lexiscan Myovue:  Indication: Chest Pain  The patient received .4 mg of Lexiscan as a bolus.  HR was stable at 91 bpm.  BP stable at 160/88.  Mild dyspnea with bolus No acute ECG changes  Significant bowel artifact especially on resting images.  No infarct or ischemia  Surface images normal.  EF 51%  Impression:  Poor quality myovue with bowel artifact.  No ischemia or infarct EF 51%  Charlton Haws MD [facility]  Original Report Authenticated By: Darcus Austin Chest Port 1 View  07/22/2011  *RADIOLOGY REPORT*  Clinical Data: Shortness of breath, follow up pneumonia  PORTABLE CHEST - 1 VIEW  Comparison: 07/21/2011  Findings: Interval extubation and removal of enteric tube.  Left IJ venous catheter with its tip in the left brachiocephalic vein.  Low lung volumes with vascular crowding. Patchy left midlung and right lower lobe opacities, possibly reflecting pneumonia.  IMPRESSION: Interval extubation.  Low lung volumes.  Patchy left midlung and right lower lobe opacities, possibly reflecting pneumonia.  Original Report Authenticated By: Charline Bills, M.D.   Dg Chest Port 1 View  07/21/2011  *RADIOLOGY REPORT*  Clinical Data: Evaluate endotracheal tube position  PORTABLE CHEST - 1 VIEW  Comparison: 07/20/2011  Findings: Grossly unchanged cardiac silhouette and mediastinal contours.  Stable position of support apparatus.  No pneumothorax. Interval improved aeration of the right mid lung with unchanged left mid and lower lung heterogeneous air space opacities.  Mild poor venous congestion without frank evidence of pulmonary edema. No definite pleural effusion or pneumothorax.  Unchanged bones.  IMPRESSION: 1.  Stable positioning of support apparatus.  No  pneumothorax. 2.  No change to minimal improved aeration of the right mid lung with persistent left mid and lower lung heterogeneous air space opacities worrisome for infection/aspiration.  Original Report Authenticated By: Waynard Reeds, M.D.   Dg Chest Portable 1 View  07/20/2011  *RADIOLOGY REPORT*  Clinical Data: Drug overdose.  Line placement and endotracheal tube placement.  PORTABLE CHEST - 1 VIEW  Comparison: Chest radiograph 07/20/2011  Findings: Two images were performed, as the left lung base is excluded from the first image. Image quality slightly degraded by portable technique.  The endotracheal tube is in satisfactory position, terminating approximately 4.8 cm above the carina.  A left IJ central venous catheter terminates in the proximal superior vena cava. A nasogastric tube can be followed into the stomach and continues below the edge of the image.  No evidence of pneumothorax. Heart, mediastinal, and hilar contours appear within normal limits.  There are patchy left perihilar and medial bibasilar opacities.  No visible pleural effusion or pneumothorax.  No acute bony abnormality identified.  IMPRESSION:  1.  Satisfactory position of endotracheal tube. 2.  Left IJ central venous catheter terminates in the proximal superior vena cava.  No evidence of pneumothorax. 3.  Patchy left perihilar and bibasilar opacities.  This could reflect atelectasis and/or airspace disease.  Original Report Authenticated By: Britta Mccreedy, M.D.  Dg Chest Portable 1 View  07/20/2011  *RADIOLOGY REPORT*  Clinical Data: Endotracheal tube placement.  Drug overdose.  PORTABLE CHEST - 1 VIEW  Comparison: No priors.  Findings: The patient is indicated have with the tip of the endotracheal tube approximately 5.6 cm above the carina.  Lung volumes are low.  There are patchy bibasilar opacities (right greater than left), concerning for sequela of aspiration, likely with superimposed atelectasis.  No pleural effusions.   Pulmonary vasculature is normal.  Heart size and mediastinal contours are within normal limits.  IMPRESSION: 1.  Tip of endotracheal tube appears properly located. 2.  Patchy bibasilar opacities favored to represent sequela of aspiration.  Original Report Authenticated By: Florencia Reasons, M.D.   2-D echocardiogram:  Study Conclusions  Left ventricle: The cavity size was normal. Systolic function was mildly reduced. The estimated ejection fraction was in the range of 45% to 50%. Mild diffuse hypokinesis.    Disposition: Discharged home in stable condition.  Diet: Heart healthy.  Activity: Increase activity gradually.  Follow-up Appts: Discharge Orders    Future Orders Please Complete By Expires   Diet - low sodium heart healthy      Increase activity slowly      Call MD for:  severe uncontrolled pain         TESTS THAT NEED FOLLOW-UP   Time spent on discharge, talking to the patient, and coordinating care: 45 mins.  Addendum: Case manager has arrange for patient to be seen at [facility] for primary care needs.  SignedMarcellus Scott, MD 07/27/2011, 4:50 PM

## 2011-07-27 NOTE — Progress Notes (Signed)
   CARE MANAGEMENT NOTE 07/27/2011  Patient:  Ronald Greer,Ronald Greer   Account Number:  [id number]  Date Initiated:  07/21/2011  Documentation initiated by:  [facility]  Subjective/Objective Assessment:   found unresponsive - intubated.  drug screen positive for opiods.  Has roomate     Action/Plan:   Anticipated DC Date:  07/27/2011   Anticipated DC Plan:  HOME/SELF CARE  In-house referral  Clinical Social Worker      DC Planning Services  CM consult  Follow-up appt scheduled      Choice offered to / List presented to:             Status of service:  Completed, signed off Medicare Important Message given?   (If response is "NO", the following Medicare IM given date fields will be blank) Date Medicare IM given:   Date Additional Medicare IM given:    Discharge Disposition:  HOME/SELF CARE  Per UR Regulation:  Reviewed for med. necessity/level of care/duration of stay  If discussed at Long Length of Stay Meetings, dates discussed:    Comments:  3/20 spoke w pt, he would like to use [facility] for new pcp. put phone # for them on dc instruct shee. office was closed at 4:55p when i called, cm will call them again in am to see if i can assist pt w estab new pcp. i will call pt at [phone] ater i talk w them. pt has bcbs ins and he can also call his cust serv rep to get list of phy in [place] that take bcbs. Ronald Yehudit Fulginiti rn,bsn [phone]  3/19 spoke w pt. he does not anticipate any dc needs. states doing better. Ronald Greer, bsn [phone]

## 2011-07-28 NOTE — Discharge Instructions (Signed)
[facility] [place] [phone], office closed on 5p on 3/20 please try on 3/21, cm will also call them in am and will speak w pt about new pcp-appt sched w dr Sharen Hones at [facility] for 5-21 at 9:30am. Call into pt w appt date and time.

## 2011-09-27 ENCOUNTER — Ambulatory Visit: Payer: BC Managed Care – PPO | Admitting: Family Medicine

## 2011-09-27 DIAGNOSIS — Z0289 Encounter for other administrative examinations: Secondary | ICD-10-CM

## 2014-10-07 ENCOUNTER — Encounter: Payer: Self-pay | Admitting: Family Medicine

## 2014-10-07 ENCOUNTER — Ambulatory Visit (INDEPENDENT_AMBULATORY_CARE_PROVIDER_SITE_OTHER): Payer: BLUE CROSS/BLUE SHIELD | Admitting: Family Medicine

## 2014-10-07 ENCOUNTER — Encounter (INDEPENDENT_AMBULATORY_CARE_PROVIDER_SITE_OTHER): Payer: Self-pay

## 2014-10-07 VITALS — BP 160/100 | HR 80 | Temp 99.2°F | Ht 70.75 in | Wt 232.5 lb

## 2014-10-07 DIAGNOSIS — F411 Generalized anxiety disorder: Secondary | ICD-10-CM

## 2014-10-07 DIAGNOSIS — E785 Hyperlipidemia, unspecified: Secondary | ICD-10-CM

## 2014-10-07 DIAGNOSIS — F1911 Other psychoactive substance abuse, in remission: Secondary | ICD-10-CM | POA: Insufficient documentation

## 2014-10-07 DIAGNOSIS — I10 Essential (primary) hypertension: Secondary | ICD-10-CM

## 2014-10-07 DIAGNOSIS — F1011 Alcohol abuse, in remission: Secondary | ICD-10-CM | POA: Insufficient documentation

## 2014-10-07 DIAGNOSIS — E119 Type 2 diabetes mellitus without complications: Secondary | ICD-10-CM

## 2014-10-07 DIAGNOSIS — F1010 Alcohol abuse, uncomplicated: Secondary | ICD-10-CM

## 2014-10-07 DIAGNOSIS — G894 Chronic pain syndrome: Secondary | ICD-10-CM

## 2014-10-07 DIAGNOSIS — F1990 Other psychoactive substance use, unspecified, uncomplicated: Secondary | ICD-10-CM

## 2014-10-07 DIAGNOSIS — M5010 Cervical disc disorder with radiculopathy, unspecified cervical region: Secondary | ICD-10-CM

## 2014-10-07 MED ORDER — SITAGLIP PHOS-METFORMIN HCL ER 100-1000 MG PO TB24: 1.0000 | ORAL_TABLET | Freq: Every day | ORAL | Status: DC

## 2014-10-07 MED ORDER — CANAGLIFLOZIN 100 MG PO TABS: 100.0000 mg | ORAL_TABLET | Freq: Every day | ORAL | Status: DC

## 2014-10-07 NOTE — Patient Instructions (Addendum)
Decrease wellbutrin to 100mg  once daily for 1 week then stop. Start buspar for anxiety - 10mg  once daily for 1 week then increase to twice daily. Start amitriptyline for sleep and anxiety and pain - 25mg  nightly for 1 week then increase to 50mg  nightly. Call me with an update in 2-3 weeks, sooner if any questions or concerns.

## 2014-10-07 NOTE — Progress Notes (Signed)
BP 160/100 mmHg  Pulse 80  Temp(Src) 99.2 F (37.3 C) (Oral)  Ht 5' 10.75" (1.797 m)  Wt 232 lb 8 oz (105.461 kg)  BMI 32.66 kg/m2   CC: new pt to establish.  Subjective:    Patient ID: Ronald Greer, male    DOB: 09-10-1975, 39 y.o.   MRN: [id number]  HPI: Ronald Greer is a 39 y.o. male presenting on 10/07/2014 for Establish Care and Anxiety   Prior saw [facility] in [facility] Dr. Elise Greer. Lives in [place]. Used to body build, played college football.   Anxiety - Main concern today. Worsening anxiety started 1 month ago. On wellbutrin  bid for last 2 months without significant improvement. Son lives in [place], unable to spend as much time as he'd like with his son. prozac made him feel spacey. Has tried klonopin which was helpful.   DM - dx 2013 and again 6 months ago. Regularly does check sugars twice daily 115-150.  Compliant with antihyperglycemic regimen which includes: canagliflozin and janumet XR.  Denies low sugars or hypoglycemic symptoms. Denies paresthesias. Known R cervical radiculopathy. Last diabetic eye exam 2014. Pneumovax: DUE. Prevnar: not due.  Lab Results  Component Value Date   HGBA1C 7.0* 07/25/2011   Diabetic Foot Exam - Simple   No data filed      HTN - compliant with metoprolol  bid. Checks bp at home - running elevated. Denies vision changes, CP/tightness, leg swelling. Occasional headaches, shortness of breath.  H/o polysubstance drug abuse (heroine, alcohol) denies cocaine or MJ or other drugs. Hospitalized 07/2011 for obtundation related to drug overuse requiring intubation. Hospital course complicated by ARF, encephalopathy with likely aspiration leading to pneumonia and sepsis.   There was also concern for R cervical radiculopathy C5/6 and C6/7. He has seen neurologist, has had NCS. Has tried gabapentin, lyrica. Prior on high dose narcotics. Last month decided to stop these medications. Pain has significantly worsened off  these medications.  He states OD was a bad decision that he made once and now he goes to church and has good family support and does not use.   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 Interim medical history since our last visit reviewed. Allergies and medications reviewed and updated. Current Outpatient Prescriptions on File Prior to Visit  Medication Sig  . Multiple Vitamins-Minerals (MULTIVITAMINS THER. W/MINERALS) TABS Take 1 tablet by mouth daily.   No current facility-administered medications on file prior to visit.    Review of Systems Per HPI unless specifically indicated above     Objective:    BP 160/100 mmHg  Pulse 80  Temp(Src) 99.2 F (37.3 C) (Oral)  Ht 5' 10.75" (1.797 m)  Wt 232 lb 8 oz (105.461 kg)  BMI 32.66 kg/m2  Wt Readings from Last 3 Encounters:  10/07/14 232 lb 8 oz (105.461 kg)  07/27/11 239 lb 6.7 oz (108.6 kg)    Physical Exam  Constitutional: He appears well-developed and well-nourished. No distress.  HENT:  Head: Normocephalic.  Mouth/Throat: Oropharynx is clear and moist. No oropharyngeal exudate.  Eyes: Conjunctivae and EOM are normal. Pupils are equal, round, and reactive to light. No scleral icterus.  Neck: Normal range of motion. Neck supple.  Cardiovascular: Normal rate, regular rhythm, normal heart  sounds and intact distal pulses.   No murmur heard. Pulmonary/Chest: Effort normal and breath sounds normal. No respiratory distress. He has no wheezes. He has no rales.  Musculoskeletal: He exhibits no edema.  Lymphadenopathy:    He has no cervical adenopathy.  Neurological:  Tremor present  Skin: Skin is warm and dry. No rash noted.  Psychiatric: His speech is normal. His mood appears  anxious. He is hyperactive.  Restless, fidgeting throughout office visit  Nursing note and vitals reviewed.      Assessment & Plan:  Over 45 minutes were spent face-to-face with the patient during this encounter and >50% of that time was spent on counseling and coordination of care  Problem List Items Addressed This Visit    Cervical disc disorder with radiculopathy of cervical region    Failed lyrica and gabapentin in the past - would consider retrial of these medications in the future.      Chronic pain syndrome    Pt has tapered off narcotics over last 2 months. Increasing pain. Hopeful for amitriptyline to improve this somewhat. Will review records from prior PCP.      Diabetes type 2, controlled    Anticipate good control from endorsed cbg's.  Requested records from prior PCP today, will discuss changing to more affordable regimen down the road. Will need pneumovax. Current regimen refilled today.      Relevant Medications   canagliflozin (INVOKANA) 100 MG TABS tablet   SitaGLIPtin-MetFORMIN HCl [phone] MG TB24   Essential hypertension - Primary    Chronic, uncontrolled. Continue metoprolol 25mg  bid. May need addition of 2nd medication but anticipate significant part of hypertension today due to anxiety.      Relevant Medications   metoprolol tartrate (LOPRESSOR) 25 MG tablet   GAD (generalized anxiety disorder)    Marked anxiety with attacks, evident on exam today as well - marked by restlessness. wellbutrin ineffective as well as prior trial of prozac. Klonopin was very helpful in the past. Will trial buspar bid + amitriptyline nightly. Discussed side effects to monitor for. Discussed will need 1 mo trial prior to knowing effectiveness of new regimen. Will also taper off wellbutrin over the next month. Hesitant to use controlled substances with his drug use history but eventually may need benzodiazepine on board given marked anxiety state present. Would need UDS prior to  prescribing any controlled substances. GAD7 = 21/21 PHQ9 = 15/27, extremely difficult to function.      History of alcohol abuse    Abstinent - rare drink now.      History of drug abuse    Denies current use - endorses one time IVDU 2013, non since.      HLD (hyperlipidemia)    Check FLP at next fasting blood work.      Relevant Medications   metoprolol tartrate (LOPRESSOR) 25 MG tablet       Follow up plan: Return in about 4 weeks (around 11/04/2014), or if symptoms worsen or fail to improve, for follow up visit.

## 2014-10-07 NOTE — Progress Notes (Signed)
Pre visit review using our clinic review tool, if applicable. No additional management support is needed unless otherwise documented below in the visit note. 

## 2014-10-08 ENCOUNTER — Encounter: Payer: Self-pay | Admitting: Family Medicine

## 2014-10-08 DIAGNOSIS — M5010 Cervical disc disorder with radiculopathy, unspecified cervical region: Secondary | ICD-10-CM | POA: Insufficient documentation

## 2014-10-08 DIAGNOSIS — G894 Chronic pain syndrome: Secondary | ICD-10-CM | POA: Insufficient documentation

## 2014-10-08 MED ORDER — AMITRIPTYLINE HCL 25 MG PO TABS: 25.0000 mg | ORAL_TABLET | Freq: Every day | ORAL | Status: DC

## 2014-10-08 MED ORDER — BUSPIRONE HCL 10 MG PO TABS: 10.0000 mg | ORAL_TABLET | Freq: Two times a day (BID) | ORAL | Status: DC

## 2014-10-08 NOTE — Assessment & Plan Note (Signed)
Check FLP at next fasting blood work.

## 2014-10-08 NOTE — Assessment & Plan Note (Addendum)
Marked anxiety with attacks, evident on exam today as well - marked by restlessness. wellbutrin ineffective as well as prior trial of prozac. Klonopin was very helpful in the past. Will trial buspar bid + amitriptyline nightly. Discussed side effects to monitor for. Discussed will need 1 mo trial prior to knowing effectiveness of new regimen. Will also taper off wellbutrin over the next month. Hesitant to use controlled substances with his drug use history but eventually may need benzodiazepine on board given marked anxiety state present. Would need UDS prior to prescribing any controlled substances. GAD7 = 21/21 PHQ9 = 15/27, extremely difficult to function.

## 2014-10-08 NOTE — Assessment & Plan Note (Signed)
Anticipate good control from endorsed cbg's.  Requested records from prior PCP today, will discuss changing to more affordable regimen down the road. Will need pneumovax. Current regimen refilled today.

## 2014-10-08 NOTE — Assessment & Plan Note (Signed)
Failed lyrica and gabapentin in the past - would consider retrial of these medications in the future.

## 2014-10-08 NOTE — Assessment & Plan Note (Signed)
Denies current use - endorses one time IVDU 2013, non since.

## 2014-10-08 NOTE — Assessment & Plan Note (Signed)
Abstinent - rare drink now.

## 2014-10-08 NOTE — Assessment & Plan Note (Signed)
Chronic, uncontrolled. Continue metoprolol 25mg  bid. May need addition of 2nd medication but anticipate significant part of hypertension today due to anxiety.

## 2014-10-08 NOTE — Assessment & Plan Note (Signed)
Pt has tapered off narcotics over last 2 months. Increasing pain. Hopeful for amitriptyline to improve this somewhat. Will review records from prior PCP.

## 2014-10-16 ENCOUNTER — Emergency Department
Admission: EM | Admit: 2014-10-16 | Discharge: 2014-10-16 | Disposition: A | Discharge status: Home / Self Care | Payer: BLUE CROSS/BLUE SHIELD

## 2014-10-16 NOTE — ED Notes (Signed)
Pt was brought in by [employer] to have blood draw. Pt calm and cooperative at this time.

## 2014-10-27 ENCOUNTER — Ambulatory Visit: Payer: BLUE CROSS/BLUE SHIELD | Admitting: Family Medicine

## 2014-10-29 ENCOUNTER — Ambulatory Visit: Payer: BLUE CROSS/BLUE SHIELD | Admitting: Anesthesiology

## 2014-10-29 ENCOUNTER — Ambulatory Visit (INDEPENDENT_AMBULATORY_CARE_PROVIDER_SITE_OTHER): Payer: BLUE CROSS/BLUE SHIELD | Admitting: Family Medicine

## 2014-10-29 ENCOUNTER — Encounter: Payer: Self-pay | Admitting: Family Medicine

## 2014-10-29 VITALS — BP 120/82 | HR 80 | Temp 98.0°F | Wt 235.1 lb

## 2014-10-29 DIAGNOSIS — I10 Essential (primary) hypertension: Secondary | ICD-10-CM | POA: Diagnosis not present

## 2014-10-29 DIAGNOSIS — E785 Hyperlipidemia, unspecified: Secondary | ICD-10-CM | POA: Diagnosis not present

## 2014-10-29 DIAGNOSIS — E119 Type 2 diabetes mellitus without complications: Secondary | ICD-10-CM

## 2014-10-29 DIAGNOSIS — F411 Generalized anxiety disorder: Secondary | ICD-10-CM | POA: Diagnosis not present

## 2014-10-29 DIAGNOSIS — G894 Chronic pain syndrome: Secondary | ICD-10-CM

## 2014-10-29 LAB — COMPREHENSIVE METABOLIC PANEL
ALK PHOS: 87 U/L (ref 39–117)
ALT: 36 U/L (ref 0–53)
AST: 32 U/L (ref 0–37)
Albumin: 4.4 g/dL (ref 3.5–5.2)
BUN: 9 mg/dL (ref 6–23)
CALCIUM: 9.5 mg/dL (ref 8.4–10.5)
CO2: 30 mEq/L (ref 19–32)
CREATININE: 1.06 mg/dL (ref 0.40–1.50)
Chloride: 99 mEq/L (ref 96–112)
GFR: 82.36 mL/min (ref >60.00)
GLUCOSE: 94 mg/dL (ref 70–99)
Potassium: 4.3 mEq/L (ref 3.5–5.1)
Sodium: 135 mEq/L (ref 135–145)
Total Bilirubin: 0.6 mg/dL (ref 0.2–1.2)
Total Protein: 7.7 g/dL (ref 6.0–8.3)

## 2014-10-29 LAB — HEMOGLOBIN A1C: HEMOGLOBIN A1C: 5.8 % (ref 4.6–6.5)

## 2014-10-29 LAB — LDL CHOLESTEROL, DIRECT: LDL DIRECT: 140 mg/dL

## 2014-10-29 MED ORDER — NABUMETONE 500 MG PO TABS: 500.0000 mg | ORAL_TABLET | Freq: Every day | ORAL | Status: DC

## 2014-10-29 MED ORDER — BUSPIRONE HCL 15 MG PO TABS: 15.0000 mg | ORAL_TABLET | Freq: Three times a day (TID) | ORAL | Status: DC

## 2014-10-29 MED ORDER — HYDROXYZINE HCL 50 MG PO TABS: 50.0000 mg | ORAL_TABLET | Freq: Every evening | ORAL | Status: DC | PRN

## 2014-10-29 NOTE — Progress Notes (Signed)
Pre visit review using our clinic review tool, if applicable. No additional management support is needed unless otherwise documented below in the visit note. 

## 2014-10-29 NOTE — Progress Notes (Signed)
BP 120/82 mmHg  Pulse 80  Temp(Src) 98 F (36.7 C) (Oral)  Wt 235 lb 1.9 oz (106.65 kg)  SpO2 96%   CC: 3 wk f/u visit  Subjective:    Patient ID: Ronald Greer, male    DOB: 28-Feb-1976, 39 y.o.   MRN: [id number]  HPI: Ronald Greer is a 39 y.o. male presenting on 10/29/2014 for Follow-up   Seen here 3 wks ago as new patient.   For anxiety - we started buspar 10mg  bid + amitriptyline nightly. Tapered off wellbutrin. Amitriptyline caused restless legs. Klonopin has helped in the past.   Insomnia - Trazodone caused oversedation. Melatonin caused restlessness. Benadryl didn't help.   Started new job - increased stress from this.   Chronic pain from cervical radiculopathy and other - self tapered off narcotics. Since he's been off narcotics noticing worsening lower back pain, R leg pain, and R arm pain. Failed lyrica and gabapentin in the past. Prior saw Dr Elise Benne in [facility]. Asks about restarting pain regimen. 800mg  ibuprofen doesn't help. Tramadol ineffective. Has set up to see Dr Carlynn Purl [facility] pain clinic.   Endorses he has history of rheumatoid arthritis but I have not yet received records.   DM - sugars running ok. Reports compliance with current med regimen. Eye exam: last saw eye doctor 2 yrs ago. Lab Results  Component Value Date   HGBA1C 5.8 10/29/2014    Discussed ER visit - [employer] brought him in for questionable intoxication while driving - passed blow test but police required him to go to [facility] ER for blood draw - no results in system. 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   Relevant past medical, surgical, family and social history reviewed and updated as indicated. Interim medical history since our last visit reviewed. Allergies and medications reviewed and updated. Current Outpatient Prescriptions on File Prior to Visit  Medication Sig  . canagliflozin (INVOKANA) 100 MG TABS tablet Take 1 tablet (100 mg total) by mouth  daily.  . metoprolol tartrate (LOPRESSOR) 25 MG tablet Take 25 mg by mouth 2 (two) times daily.   No current facility-administered medications on file prior to visit.    Review of Systems Per HPI unless specifically indicated above     Objective:    BP 120/82 mmHg  Pulse 80  Temp(Src) 98 F (36.7 C) (Oral)  Wt 235 lb 1.9 oz (106.65 kg)  SpO2 96%  Wt Readings from Last 3 Encounters:  10/29/14 235 lb 1.9 oz (106.65 kg)  10/07/14 232 lb 8 oz (105.461 kg)  07/27/11 239 lb 6.7 oz (108.6 kg)    Physical Exam  Constitutional: He appears well-developed and well-nourished. No distress.  HENT:  Head: Normocephalic and atraumatic.  Right Ear: External ear normal.  Left Ear: External ear normal.  Nose: Nose normal.  Mouth/Throat: Oropharynx is clear and moist. No oropharyngeal exudate.  Eyes: Conjunctivae and EOM are normal. Pupils are equal, round, and reactive to light. No scleral icterus.  Neck: Normal range of motion. Neck supple.  Cardiovascular: Normal rate, regular rhythm, normal heart sounds and intact distal pulses.   No murmur heard. Pulmonary/Chest: Effort normal and breath sounds normal. No respiratory distress. He has no wheezes. He has no rales.  Musculoskeletal: He exhibits no edema.  See HPI for foot exam if done  Lymphadenopathy:    He has no cervical adenopathy.  Skin: Skin is warm and dry. No rash noted.  Psychiatric:  He has a normal mood and affect.  Nursing note and vitals reviewed.     Assessment & Plan:   Problem List Items Addressed This Visit    Chronic pain syndrome    Did not tolerate amitriptyline. Pt desires to restart narcotics. Given history, recommended he establish with pain clinic - has appt with Dr Carlynn Purl in [place] for next week. In the interim, will trial nabumetone  daily.      Diabetes type 2, controlled    Check labwork today. Foot exam today. Reports compliance with regimen. Continue janumet/invokana      Relevant Medications    SitaGLIPtin-MetFORMIN HCl [phone] MG TB24   Other Relevant Orders   Hemoglobin A1c (Completed)   Essential hypertension    Chronic, stable. Continue metoprolol  bid.      Relevant Orders   Comprehensive metabolic panel (Completed)   GAD (generalized anxiety disorder) - Primary    Less anxious today. However, did not tolerate amitriptyline, and has previously failed wellbutrin, prozac. Doesn't feel buspar effective but we're still on a low dose - will continue titration of buspar to  bid and after 1-2 wks may increase to TID. Discussed monitoring for dizziness or intolerable side effects. If ineffective, may need to start benzo - but would need UDS prior to prescribing. For insomnia - start hydroxyzine.       HLD (hyperlipidemia)    Check d LDL today.      Relevant Orders   Comprehensive metabolic panel (Completed)   LDL Cholesterol, Direct (Completed)       Follow up plan: Return in about 1 month (around 11/28/2014), or if symptoms worsen or fail to improve, for follow up visit.

## 2014-10-29 NOTE — Patient Instructions (Addendum)
Try hydroxyzine for sleep 50mg  nightly. Slowly increase buspar - take 15mg  twice daily for next 1 week and if no improvement noted and tolerated well, may increase to 15mg  three times a day. Blood work today. We will refer you to pain clinic. In the meantime let's try nabumetone for pain.

## 2014-10-30 ENCOUNTER — Other Ambulatory Visit: Payer: Self-pay | Admitting: Family Medicine

## 2014-10-30 NOTE — Assessment & Plan Note (Signed)
Less anxious today. However, did not tolerate amitriptyline, and has previously failed wellbutrin, prozac. Doesn't feel buspar effective but we're still on a low dose - will continue titration of buspar to 15mg  bid and after 1-2 wks may increase to TID. Discussed monitoring for dizziness or intolerable side effects. If ineffective, may need to start benzo - but would need UDS prior to prescribing. For insomnia - start hydroxyzine.

## 2014-10-30 NOTE — Assessment & Plan Note (Signed)
Did not tolerate amitriptyline. Pt desires to restart narcotics. Given history, recommended he establish with pain clinic - has appt with Dr Carlynn Purl in [place] for next week. In the interim, will trial nabumetone 500mg  daily.

## 2014-10-30 NOTE — Assessment & Plan Note (Signed)
Chronic, stable. Continue metoprolol 25mg bid. 

## 2014-10-30 NOTE — Assessment & Plan Note (Addendum)
Check labwork today. Foot exam today. Reports compliance with regimen. Continue janumet/invokana

## 2014-10-30 NOTE — Assessment & Plan Note (Signed)
Check dLDL today. 

## 2014-11-03 ENCOUNTER — Ambulatory Visit: Payer: BLUE CROSS/BLUE SHIELD | Admitting: Family Medicine

## 2014-11-05 ENCOUNTER — Ambulatory Visit: Payer: BLUE CROSS/BLUE SHIELD | Admitting: Anesthesiology

## 2014-11-12 ENCOUNTER — Ambulatory Visit: Payer: BLUE CROSS/BLUE SHIELD | Admitting: Anesthesiology

## 2014-11-21 ENCOUNTER — Ambulatory Visit: Payer: BLUE CROSS/BLUE SHIELD | Admitting: Family Medicine

## 2014-11-21 DIAGNOSIS — Z0289 Encounter for other administrative examinations: Secondary | ICD-10-CM

## 2015-02-14 ENCOUNTER — Encounter: Payer: Self-pay | Admitting: *Deleted

## 2015-02-14 ENCOUNTER — Emergency Department
Admission: EM | Admit: 2015-02-14 | Discharge: 2015-02-14 | Disposition: A | Discharge status: Home / Self Care | Payer: Self-pay | Attending: Emergency Medicine | Admitting: Emergency Medicine

## 2015-02-14 DIAGNOSIS — E119 Type 2 diabetes mellitus without complications: Secondary | ICD-10-CM | POA: Insufficient documentation

## 2015-02-14 DIAGNOSIS — Z79899 Other long term (current) drug therapy: Secondary | ICD-10-CM | POA: Insufficient documentation

## 2015-02-14 DIAGNOSIS — F1110 Opioid abuse, uncomplicated: Secondary | ICD-10-CM | POA: Insufficient documentation

## 2015-02-14 DIAGNOSIS — Z791 Long term (current) use of non-steroidal anti-inflammatories (NSAID): Secondary | ICD-10-CM | POA: Insufficient documentation

## 2015-02-14 DIAGNOSIS — F10920 Alcohol use, unspecified with intoxication, uncomplicated: Secondary | ICD-10-CM

## 2015-02-14 DIAGNOSIS — F10220 Alcohol dependence with intoxication, uncomplicated: Secondary | ICD-10-CM | POA: Insufficient documentation

## 2015-02-14 DIAGNOSIS — I10 Essential (primary) hypertension: Secondary | ICD-10-CM | POA: Insufficient documentation

## 2015-02-14 DIAGNOSIS — F1910 Other psychoactive substance abuse, uncomplicated: Secondary | ICD-10-CM | POA: Insufficient documentation

## 2015-02-14 LAB — COMPREHENSIVE METABOLIC PANEL
ALBUMIN: 4.4 g/dL (ref 3.5–5.0)
ALK PHOS: 93 U/L (ref 38–126)
ALT: 103 U/L — ABNORMAL HIGH (ref 17–63)
AST: 79 U/L — AB (ref 15–41)
Anion gap: 12 (ref 5–15)
BILIRUBIN TOTAL: 0.4 mg/dL (ref 0.3–1.2)
BUN: 9 mg/dL (ref 6–20)
CALCIUM: 8.5 mg/dL — AB (ref 8.9–10.3)
CO2: 22 mmol/L (ref 22–32)
Chloride: 103 mmol/L (ref 101–111)
Creatinine, Ser: 0.8 mg/dL (ref 0.61–1.24)
GFR calc Af Amer: >60 mL/min (ref >60)
GFR calc non Af Amer: >60 mL/min (ref >60)
GLUCOSE: 188 mg/dL — AB (ref 65–99)
Potassium: 4.2 mmol/L (ref 3.5–5.1)
Sodium: 137 mmol/L (ref 135–145)
TOTAL PROTEIN: 7.4 g/dL (ref 6.5–8.1)

## 2015-02-14 LAB — CBC
HCT: 46.5 % (ref 40.0–52.0)
Hemoglobin: 15.9 g/dL (ref 13.0–18.0)
MCH: 31.3 pg (ref 26.0–34.0)
MCHC: 34.2 g/dL (ref 32.0–36.0)
MCV: 91.7 fL (ref 80.0–100.0)
PLATELETS: 251 10*3/uL (ref 150–440)
RBC: 5.07 MIL/uL (ref 4.40–5.90)
RDW: 13 % (ref 11.5–14.5)
WBC: 9.3 10*3/uL (ref 3.8–10.6)

## 2015-02-14 LAB — URINE DRUG SCREEN, QUALITATIVE (ARMC ONLY)
AMPHETAMINES, UR SCREEN: NEGATIVE — AB
BARBITURATES, UR SCREEN: NEGATIVE — AB
BENZODIAZEPINE, UR SCRN: POSITIVE — AB
Cannabinoid 50 Ng, Ur ~~LOC~~: NEGATIVE — AB
Cocaine Metabolite,Ur ~~LOC~~: NEGATIVE — AB
MDMA (Ecstasy)Ur Screen: NEGATIVE — AB
METHADONE SCREEN, URINE: NEGATIVE — AB
Opiate, Ur Screen: POSITIVE — AB
Phencyclidine (PCP) Ur S: NEGATIVE — AB
TRICYCLIC, UR SCREEN: NEGATIVE — AB

## 2015-02-14 LAB — ETHANOL: Alcohol, Ethyl (B): 267 mg/dL — ABNORMAL HIGH (ref <5)

## 2015-02-14 NOTE — ED Notes (Signed)
Pt ambulated to bathroom at this time x assist 1, pt tolerated well, no acute distress noted.

## 2015-02-14 NOTE — BH Assessment (Signed)
Assessment Note  Ronald Greer is an 39 y.o. male voluntarily for alcohol intoxification. Pt states he drinks a "6 pack 2-3 x a week" for the past 4-5 years. Pt drank more today because he was tailgating at a football game. He believes to have drank a 12 pack of beer. Pt reports he also takes Xanax a couple times a week when he is feeling upset about his son.  Pts son lives with him half of the time and he misses him when he is not present.  Pt states he does not want help for the alcohol.   Diagnosis: Substance Use- Alcohol  Past Medical History:  Past Medical History  Diagnosis Date  . History of drug abuse     IV, x1 heroine  . History of alcohol abuse   . Acute encephalopathy 07/20/2011    hospitalization with ARF sepsis and vent dependent resp failure  . GAD (generalized anxiety disorder)   . Anxiety attack   . Essential hypertension   . HLD (hyperlipidemia)   . Diabetes type 2, controlled [facility])     Past Surgical History  Procedure Laterality Date  . Ankle surgery Right     x 2  . Rotator cuff repair Left   . Arthroscopic repair acl Right   . Shoulder arthroscopy Left     x 2    Family History:  Family History  Problem Relation Age of Onset  . Hyperlipidemia Mother   . Hypertension Mother   . CAD Maternal Grandfather     MI  . Cancer Mother     breast  . Cancer Father     bone  . Diabetes Mother     Social History:  reports that he has never smoked. His smokeless tobacco use includes Snuff. He reports that he drinks alcohol. He reports that he does not use illicit drugs.  Additional Social History:  Alcohol / Drug Use Pain Medications: None Reported Prescriptions: None Reported Over the Counter: None Reported History of alcohol / drug use?: Yes Substance #1 Name of Substance 1: Alcohol 1 - Age of First Use: 16 1 - Amount (size/oz): "6 pack" 1 - Frequency: 2-3 x a week  1 - Duration: past 6 years 1 - Last Use / Amount: 02/14/15 Substance #2 Name of  Substance 2: Xanax 2 - Age of First Use: 24 2 - Amount (size/oz): 1-2 pills 2 - Frequency: 3 x a week 2 - Duration: 3 years 2 - Last Use / Amount: 02/13/15  CIWA: CIWA-Ar BP: 136/78 mmHg Pulse Rate: 93 COWS:    Allergies:  Allergies  Allergen Reactions  . Avelox [Moxifloxacin Hcl In Nacl] Anaphylaxis  . Amitriptyline Other (See Comments)    Restless legs    Home Medications:  (Not in a hospital admission)  OB/GYN Status:  No LMP for male patient.  General Assessment Data Location of Assessment: [facility] ED TTS Assessment: In system Is this a Tele or Face-to-Face Assessment?: Face-to-Face Is this an Initial Assessment or a Re-assessment for this encounter?: Initial Assessment Marital status: Divorced [place] name: n/a Is patient pregnant?: No Pregnancy Status: No Living Arrangements: Other relatives Can pt return to current living arrangement?: Yes Admission Status: Voluntary Is patient capable of signing voluntary admission?: Yes Referral Source: Self/Family/Friend Insurance type: None  Medical Screening Exam [facility]) Medical Exam completed: Yes  Crisis Care Plan Living Arrangements: Other relatives Name of Psychiatrist: None Name of Therapist: None  Education Status Is patient currently in school?: No  Current Grade: -0 Highest grade of school patient has completed: Bachelors Name of school: None  Contact person: None  Risk to self with the past 6 months Suicidal Ideation: No Has patient been a risk to self within the past 6 months prior to admission? : No Suicidal Intent: No Has patient had any suicidal intent within the past 6 months prior to admission? : No Is patient at risk for suicide?: No Suicidal Plan?: No Has patient had any suicidal plan within the past 6 months prior to admission? : No Access to Means: No What has been your use of drugs/alcohol within the last 12 months?: Alcohol, "6 pack 2-3 x a week," Xanax, 1-2 pills, "couple times a  week" Previous Attempts/Gestures: No How many times?: 0 Other Self Harm Risks: 0 Triggers for Past Attempts: None known Intentional Self Injurious Behavior: None Family Suicide History: No Persecutory voices/beliefs?: No Depression: No Substance abuse history and/or treatment for substance abuse?: No Suicide prevention information given to non-admitted patients: Not applicable  Risk to Others within the past 6 months Homicidal Ideation: No Does patient have any lifetime risk of violence toward others beyond the six months prior to admission? : No Thoughts of Harm to Others: No Current Homicidal Intent: No Current Homicidal Plan: No Access to Homicidal Means: No Identified Victim: None reported History of harm to others?: No Assessment of Violence: None Noted Violent Behavior Description: None noted Does patient have access to weapons?: No Criminal Charges Pending?: No Does patient have a court date: No Is patient on probation?: No  Psychosis Hallucinations: None noted Delusions: None noted  Mental Status Report Appearance/Hygiene: Disheveled Eye Contact: Fair Motor Activity: Unremarkable Speech: Unremarkable Level of Consciousness: Quiet/awake Mood: Pleasant Affect: Appropriate to circumstance Anxiety Level: None Thought Processes: Coherent Judgement: Partial Orientation: Person, Place, Time, Situation, Appropriate for developmental age Obsessive Compulsive Thoughts/Behaviors: None  Cognitive Functioning Concentration: Normal Memory: Recent Intact, Remote Intact IQ: Average Insight: Fair Impulse Control: Fair Appetite: Fair Weight Loss: 0 Weight Gain: 0 Sleep: No Change Total Hours of Sleep: 3 Vegetative Symptoms: None  ADLScreening [facility] Assessment Services) Patient's cognitive ability adequate to safely complete daily activities?: Yes Patient able to express need for assistance with ADLs?: Yes Independently performs ADLs?: Yes (appropriate for  developmental age)  Prior Inpatient Therapy Prior Inpatient Therapy: No  Prior Outpatient Therapy Prior Outpatient Therapy: No Does patient have an ACCT team?: No Does patient have Intensive In-House Services?  : No Does patient have [facility] services? : No Does patient have P4CC services?: No  ADL Screening (condition at time of admission) Patient's cognitive ability adequate to safely complete daily activities?: Yes Patient able to express need for assistance with ADLs?: Yes Independently performs ADLs?: Yes (appropriate for developmental age)       Abuse/Neglect Assessment (Assessment to be complete while patient is alone) Physical Abuse: Denies Verbal Abuse: Denies Sexual Abuse: Denies Exploitation of patient/patient's resources: Denies Self-Neglect: Denies Values / Beliefs Cultural Requests During Hospitalization: None Spiritual Requests During Hospitalization: None Consults Spiritual Care Consult Needed: No Social Work Consult Needed: No Merchant navy officer (For Healthcare) Does patient have an advance directive?: No    Additional Information 1:1 In Past 12 Months?: No CIRT Risk: No Elopement Risk: No Does patient have medical clearance?: Yes     Disposition:  Disposition Initial Assessment Completed for this Encounter: Yes Disposition of Patient: Referred to Patient referred to: Other (Comment) (Pt did not want to be referred to detox)  On Site Evaluation by:  Reviewed with Physician:    Ramon Dredge Shalena Ezzell 02/14/2015 5:34 PM

## 2015-02-14 NOTE — ED Notes (Signed)
Pt reports wanting help with alcohol. States last drink this am. Pt reports drinking 12 pack a day of beer. Never been in treatment before.

## 2015-02-14 NOTE — ED Provider Notes (Signed)
[facility] Emergency Department Provider Note  ____________________________________________  Time seen: Approximately 3:34 PM  I have reviewed the triage vital signs and the nursing notes.   HISTORY  Chief Complaint Alcohol Intoxication  History and physical are limited due to patient's significant intoxication and inability to comply with answering questions or following instructions.  HPI Ronald Greer is a 39 y.o. male with a history of alcohol and drug abuse, generalized anxiety disorder, DM 2, presenting for acute alcohol intoxication. The patient is unable to give me any information about why he is here, but the nursing note from his arrival says that he came requesting detox.   Past Medical History  Diagnosis Date  . History of drug abuse     IV, x1 heroine  . History of alcohol abuse   . Acute encephalopathy 07/20/2011    hospitalization with ARF sepsis and vent dependent resp failure  . GAD (generalized anxiety disorder)   . Anxiety attack   . Essential hypertension   . HLD (hyperlipidemia)   . Diabetes type 2, controlled [facility])     Patient Active Problem List   Diagnosis Date Noted  . Cervical disc disorder with radiculopathy of cervical region 10/08/2014  . Chronic pain syndrome 10/08/2014  . Essential hypertension   . HLD (hyperlipidemia)   . Diabetes type 2, controlled (HCC)   . GAD (generalized anxiety disorder)   . History of drug abuse   . History of alcohol abuse     Past Surgical History  Procedure Laterality Date  . Ankle surgery Right     x 2  . Rotator cuff repair Left   . Arthroscopic repair acl Right   . Shoulder arthroscopy Left     x 2    Current Outpatient Rx  Name  Route  Sig  Dispense  Refill  . busPIRone (BUSPAR) 15 MG tablet   Oral   Take 1 tablet (15 mg total) by mouth 3 (three) times daily.   90 tablet   1   . hydrOXYzine (ATARAX/VISTARIL) 50 MG tablet   Oral   Take 1 tablet (50 mg total) by mouth  at bedtime as needed (sleep).   30 tablet   0   . metoprolol tartrate (LOPRESSOR) 25 MG tablet   Oral   Take 25 mg by mouth 2 (two) times daily.         . nabumetone (RELAFEN) 500 MG tablet   Oral   Take 1 tablet (500 mg total) by mouth daily.   30 tablet   1   . SitaGLIPtin-MetFORMIN HCl [phone] MG TB24   Oral   Take 1 tablet by mouth daily.           Allergies Avelox and Amitriptyline  Family History  Problem Relation Age of Onset  . Hyperlipidemia Mother   . Hypertension Mother   . CAD Maternal Grandfather     MI  . Cancer Mother     breast  . Cancer Father     bone  . Diabetes Mother     Social History Social History  Substance Use Topics  . Smoking status: Never Smoker   . Smokeless tobacco: Current User    Types: Snuff  . Alcohol Use: 0.0 oz/week    0 Standard drinks or equivalent per week     Comment: every day    Review of Systems Unable to obtain due to patient mental status. ____________________________________________   PHYSICAL EXAM:  VITAL SIGNS: ED Triage  Vitals  Enc Vitals Group     BP 02/14/15 1335 136/78 mmHg     Pulse Rate 02/14/15 1335 93     Resp 02/14/15 1335 16     Temp 02/14/15 1335 98.2 F (36.8 C)     Temp Source 02/14/15 1335 Oral     SpO2 02/14/15 1335 97 %     Weight 02/14/15 1335 240 lb (108.863 kg)     Height 02/14/15 1335 6' (1.829 m)     Head Cir --      Peak Flow --      Pain Score 02/14/15 1332 10     Pain Loc --      Pain Edu? --      Excl. in GC? --     Constitutional: Patient is responsive to noxious stimulus only. Sleeping in the stretcher. Patient is not tremulous or shaky. Eyes: Conjunctivae are normal.  Head: Atraumatic. No raccoon eyes or Battle sign. Nose: No congestion/rhinnorhea. Mouth/Throat: Mucous membranes are moist.  Neck: No stridor.  Supple.   Cardiovascular: Normal rate, regular rhythm. No murmurs, rubs or gallops.  Respiratory: Normal respiratory effort.  No retractions. Lungs  CTAB.  No wheezes, rales or ronchi. Gastrointestinal: Soft and nontender. No distention. No peritoneal signs. Musculoskeletal: No LE edema.  Neurologic:  Patient is responsive only to sternal rub. His speech is slurred. He is unable to stay awake her conversation. Moves all extremities well. Pupils are symmetric bilaterally.  Skin:  Skin is warm, dry and intact. No rash noted. Psychiatric: Unable to assess due to decreased responsiveness. ____________________________________________   LABS (all labs ordered are listed, but only abnormal results are displayed)  Labs Reviewed  COMPREHENSIVE METABOLIC PANEL - Abnormal; Notable for the following:    Glucose, Bld 188 (*)    Calcium 8.5 (*)    AST 79 (*)    ALT 103 (*)    All other components within normal limits  ETHANOL - Abnormal; Notable for the following:    Alcohol, Ethyl (B) 267 (*)    All other components within normal limits  URINE DRUG SCREEN, QUALITATIVE ([facility] ONLY) - Abnormal; Notable for the following:    Tricyclic, Ur Screen NEGATIVE (*)    Amphetamines, Ur Screen NEGATIVE (*)    MDMA (Ecstasy)Ur Screen NEGATIVE (*)    Cocaine Metabolite,Ur [place] NEGATIVE (*)    Opiate, Ur Screen POSITIVE (*)    Phencyclidine (PCP) Ur S NEGATIVE (*)    Cannabinoid 50 Ng, Ur  NEGATIVE (*)    Barbiturates, Ur Screen NEGATIVE (*)    Benzodiazepine, Ur Scrn POSITIVE (*)    Methadone Scn, Ur NEGATIVE (*)    All other components within normal limits  CBC   ____________________________________________  EKG  Not indicated.   ____________________________________________  RADIOLOGY  No results found.  ____________________________________________   PROCEDURES  Procedure(s) performed: None  Critical Care performed: No ____________________________________________   INITIAL IMPRESSION / ASSESSMENT AND PLAN / ED COURSE  Pertinent labs & imaging results that were available during my care of the patient were reviewed by me and  considered in my medical decision making (see chart for details).  39 y.o. male with history of drug and alcohol abuse presenting with acute alcohol intoxication, requesting detox. At this time, he is unable to comply with exam or continued evaluation. His alcohol level does suggest the cause for his altered mental status. I will continue to monitor his vital signs and reevaluate him when he was able to hold a  conversation.  ----------------------------------------- 3:44 PM on 02/14/2015 -----------------------------------------  Patient is now alert, sitting up, and able to speak in full sentences and answer questions properly. He is not having any pain at this time. He does not report any nausea, vomiting, or shaky feeling. He does wish to have help with detox. He denies any suicidal ideations, homicidal ideations, or hallucinations. I speak with the social worker about transferring him for  ----------------------------------------- 5:25 PM on 02/14/2015 -----------------------------------------  The patient has stable vital signs, is able to eat and ambulate without difficulty. He has been medically cleared and was seen by TTS specialist. At this time he is refusing detox from alcohol. I will plan to discharge him and hand are sensate he may return anytime he changes his mind. He understands return precautions and follow-up instructions. ____________________________________________  FINAL CLINICAL IMPRESSION(S) / ED DIAGNOSES  Final diagnoses:  Acute alcohol intoxication, uncomplicated (HCC)  Alcohol dependence with uncomplicated intoxication (HCC)      NEW MEDICATIONS STARTED DURING THIS VISIT:  New Prescriptions   No medications on file     Rockne Menghini, MD 02/14/15 1726

## 2015-02-14 NOTE — ED Notes (Signed)

## 2015-02-14 NOTE — Discharge Instructions (Signed)
Please make an appointment with your primary care physician for further evaluation.  Please return to the emergency department if you develop thoughts of hurting yourself or anyone else, hallucinations, tremors, seizures, or any other symptoms concerning to you.  Alcohol Intoxication Alcohol intoxication occurs when you drink enough alcohol that it affects your ability to function. It can be mild or very severe. Drinking a lot of alcohol in a short time is called binge drinking. This can be very harmful. Drinking alcohol can also be more dangerous if you are taking medicines or other drugs. Some of the effects caused by alcohol may include:  Loss of coordination.  Changes in mood and behavior.  Unclear thinking.  Trouble talking (slurred speech).  Throwing up (vomiting).  Confusion.  Slowed breathing.  Twitching and shaking (seizures).  Loss of consciousness. HOME CARE  Do not drive after drinking alcohol.  Drink enough water and fluids to keep your pee (urine) clear or pale yellow. Avoid caffeine.  Only take medicine as told by your doctor. GET HELP IF:  You throw up (vomit) many times.  You do not feel better after a few days.  You frequently have alcohol intoxication. Your doctor can help decide if you should see a substance use treatment counselor. GET HELP RIGHT AWAY IF:  You become shaky when you stop drinking.  You have twitching and shaking.  You throw up blood. It may look bright red or like coffee grounds.  You notice blood in your poop (bowel movements).  You become lightheaded or pass out (faint). MAKE SURE YOU:   Understand these instructions.  Will watch your condition.  Will get help right away if you are not doing well or get worse.   This information is not intended to replace advice given to you by your health care provider. Make sure you discuss any questions you have with your health care provider.   Document Released: 10/12/2007 Document  Revised: 12/26/2012 Document Reviewed: 09/28/2012 Elsevier Interactive Patient Education 2016 Elsevier Inc[employer]  Chemical Dependency Chemical dependency is an addiction to drugs or alcohol. It is characterized by the repeated behavior of seeking out and using drugs and alcohol despite harmful consequences to the health and safety of ones self and others.  RISK FACTORS There are certain situations or behaviors that increase a person's risk for chemical dependency. These include:  A family history of chemical dependency.  A history of mental health issues, including depression and anxiety.  A home environment where drugs and alcohol are easily available to you.  Drug or alcohol use at a young age. SYMPTOMS  The following symptoms can indicate chemical dependency:  Inability to limit the use of drugs or alcohol.  Nausea, sweating, shakiness, and anxiety that occurs when alcohol or drugs are not being used.  An increase in amount of drugs or alcohol that is necessary to get drunk or high. People who experience these symptoms can assess their use of drugs and alcohol by asking themselves the following questions:  Have you been told by friends or family that they are worried about your use of alcohol or drugs?  Do friends and family ever tell you about things you did while drinking alcohol or using drugs that you do not remember?  Do you lie about using alcohol or drugs or about the amounts you use?  Do you have difficulty completing daily tasks unless you use alcohol or drugs?  Is the level of your work or school performance lower because of  your drug or alcohol use?  Do you get sick from using drugs or alcohol but keep using anyway?  Do you feel uncomfortable in social situations unless you use alcohol or drugs?  Do you use drugs or alcohol to help forget problems? An answer of yes to any of these questions may indicate chemical dependency. Professional evaluation is suggested.     This information is not intended to replace advice given to you by your health care provider. Make sure you discuss any questions you have with your health care provider.   Document Released: 04/19/2001 Document Revised: 07/18/2011 Document Reviewed: 07/01/2010 Elsevier Interactive Patient Education [employer]

## 2015-02-14 NOTE — ED Notes (Signed)
Friend who dropped pt off states when he needs a ride home to call him.  pts family lives several hours away.  Avelina Laine [phone]

## 2015-02-14 NOTE — ED Notes (Signed)
BEHAVIORAL HEALTH ROUNDING Patient sleeping: No. Patient alert and oriented: yes Behavior appropriate: Yes.  ;  Nutrition and fluids offered: Yes  Toileting and hygiene offered: Yes  Sitter present: yes Law enforcement present: Yes  

## 2015-02-14 NOTE — ED Notes (Signed)
MD Norman at bedside 

## 2015-02-14 NOTE — ED Notes (Signed)
MD Sharma Covert at bedside at this time

## 2015-02-14 NOTE — ED Notes (Signed)
Pt given belongings back at this time, pt will be d/c once able to get ride due to ETOH use today. Pt made aware and verbalized understanding at this time. Pt calm and cooperative, no acute distress noted.

## 2016-10-26 ENCOUNTER — Encounter (HOSPITAL_COMMUNITY): Payer: Self-pay | Admitting: Emergency Medicine

## 2016-10-26 ENCOUNTER — Emergency Department (HOSPITAL_COMMUNITY): Payer: PRIVATE HEALTH INSURANCE

## 2016-10-26 DIAGNOSIS — E119 Type 2 diabetes mellitus without complications: Secondary | ICD-10-CM | POA: Insufficient documentation

## 2016-10-26 DIAGNOSIS — F1010 Alcohol abuse, uncomplicated: Secondary | ICD-10-CM | POA: Diagnosis not present

## 2016-10-26 DIAGNOSIS — Z79899 Other long term (current) drug therapy: Secondary | ICD-10-CM | POA: Insufficient documentation

## 2016-10-26 DIAGNOSIS — I10 Essential (primary) hypertension: Secondary | ICD-10-CM | POA: Insufficient documentation

## 2016-10-26 DIAGNOSIS — H8112 Benign paroxysmal vertigo, left ear: Secondary | ICD-10-CM | POA: Diagnosis not present

## 2016-10-26 DIAGNOSIS — Z7984 Long term (current) use of oral hypoglycemic drugs: Secondary | ICD-10-CM | POA: Insufficient documentation

## 2016-10-26 DIAGNOSIS — R748 Abnormal levels of other serum enzymes: Secondary | ICD-10-CM | POA: Insufficient documentation

## 2016-10-26 DIAGNOSIS — R42 Dizziness and giddiness: Secondary | ICD-10-CM | POA: Diagnosis present

## 2016-10-26 LAB — DIFFERENTIAL
Basophils Absolute: 0.1 10*3/uL (ref 0.0–0.1)
Basophils Relative: 1 %
EOS PCT: 2 %
Eosinophils Absolute: 0.1 10*3/uL (ref 0.0–0.7)
LYMPHS ABS: 2.1 10*3/uL (ref 0.7–4.0)
LYMPHS PCT: 32 %
MONO ABS: 0.3 10*3/uL (ref 0.1–1.0)
Monocytes Relative: 4 %
NEUTROS ABS: 4 10*3/uL (ref 1.7–7.7)
Neutrophils Relative %: 61 %

## 2016-10-26 LAB — COMPREHENSIVE METABOLIC PANEL
ALBUMIN: 4.5 g/dL (ref 3.5–5.0)
ALK PHOS: 98 U/L (ref 38–126)
ALT: 290 U/L — AB (ref 17–63)
ANION GAP: 13 (ref 5–15)
AST: 277 U/L — ABNORMAL HIGH (ref 15–41)
BILIRUBIN TOTAL: 0.6 mg/dL (ref 0.3–1.2)
BUN: 13 mg/dL (ref 6–20)
CALCIUM: 8.9 mg/dL (ref 8.9–10.3)
CO2: 22 mmol/L (ref 22–32)
CREATININE: 0.95 mg/dL (ref 0.61–1.24)
Chloride: 101 mmol/L (ref 101–111)
GFR calc non Af Amer: >60 mL/min (ref >60)
GLUCOSE: 257 mg/dL — AB (ref 65–99)
Potassium: 3.7 mmol/L (ref 3.5–5.1)
SODIUM: 136 mmol/L (ref 135–145)
TOTAL PROTEIN: 7.7 g/dL (ref 6.5–8.1)

## 2016-10-26 LAB — I-STAT CHEM 8, ED
BUN: 15 mg/dL (ref 6–20)
CALCIUM ION: 1.03 mmol/L — AB (ref 1.15–1.40)
CHLORIDE: 102 mmol/L (ref 101–111)
Creatinine, Ser: 1.1 mg/dL (ref 0.61–1.24)
GLUCOSE: 267 mg/dL — AB (ref 65–99)
HCT: 50 % (ref 39.0–52.0)
Hemoglobin: 17 g/dL (ref 13.0–17.0)
Potassium: 3.9 mmol/L (ref 3.5–5.1)
Sodium: 140 mmol/L (ref 135–145)
TCO2: 21 mmol/L (ref 0–100)

## 2016-10-26 LAB — PROTIME-INR
INR: 0.98
Prothrombin Time: 13 seconds (ref 11.4–15.2)

## 2016-10-26 LAB — CBC
HCT: 47.7 % (ref 39.0–52.0)
HEMOGLOBIN: 16.6 g/dL (ref 13.0–17.0)
MCH: 33 pg (ref 26.0–34.0)
MCHC: 34.8 g/dL (ref 30.0–36.0)
MCV: 94.8 fL (ref 78.0–100.0)
PLATELETS: 251 10*3/uL (ref 150–400)
RBC: 5.03 MIL/uL (ref 4.22–5.81)
RDW: 12.4 % (ref 11.5–15.5)
WBC: 6.5 10*3/uL (ref 4.0–10.5)

## 2016-10-26 LAB — APTT: aPTT: 34 seconds (ref 24–36)

## 2016-10-26 LAB — I-STAT TROPONIN, ED: Troponin i, poc: 0 ng/mL (ref 0.00–0.08)

## 2016-10-26 NOTE — ED Triage Notes (Signed)
Patient reports dizziness with nausea since Sunday.  Feels confused and like he may pass out.  Hx of drug use but clean for 1 1/2 years.  Per patient on new medications for about a month and a half.  Unsure if related.

## 2016-10-27 ENCOUNTER — Emergency Department (HOSPITAL_COMMUNITY)
Admission: EM | Admit: 2016-10-27 | Discharge: 2016-10-27 | Disposition: A | Discharge status: Home / Self Care | Payer: PRIVATE HEALTH INSURANCE | Attending: Emergency Medicine | Admitting: Emergency Medicine

## 2016-10-27 DIAGNOSIS — R748 Abnormal levels of other serum enzymes: Secondary | ICD-10-CM

## 2016-10-27 DIAGNOSIS — F1010 Alcohol abuse, uncomplicated: Secondary | ICD-10-CM

## 2016-10-27 DIAGNOSIS — H8112 Benign paroxysmal vertigo, left ear: Secondary | ICD-10-CM

## 2016-10-27 LAB — RAPID URINE DRUG SCREEN, HOSP PERFORMED
AMPHETAMINES: NOT DETECTED
BARBITURATES: NOT DETECTED
BENZODIAZEPINES: NOT DETECTED
COCAINE: NOT DETECTED
Opiates: NOT DETECTED
Tetrahydrocannabinol: NOT DETECTED

## 2016-10-27 LAB — URINALYSIS, ROUTINE W REFLEX MICROSCOPIC
BILIRUBIN URINE: NEGATIVE
Bacteria, UA: NONE SEEN
Glucose, UA: >=500 mg/dL — AB
Hgb urine dipstick: NEGATIVE
Ketones, ur: 5 mg/dL — AB
LEUKOCYTES UA: NEGATIVE
Nitrite: NEGATIVE
Protein, ur: 30 mg/dL — AB
RBC / HPF: NONE SEEN RBC/hpf (ref 0–5)
SPECIFIC GRAVITY, URINE: 1.023 (ref 1.005–1.030)
SQUAMOUS EPITHELIAL / LPF: NONE SEEN
pH: 5 (ref 5.0–8.0)

## 2016-10-27 LAB — CBG MONITORING, ED: Glucose-Capillary: 236 mg/dL — ABNORMAL HIGH (ref 65–99)

## 2016-10-27 MED ORDER — ONDANSETRON 4 MG PO TBDP
4.0000 mg | ORAL_TABLET | Freq: Once | ORAL | Status: AC
  Administered 2016-10-27: 4 mg via ORAL
  Filled 2016-10-27: qty 1

## 2016-10-27 MED ORDER — MECLIZINE HCL 25 MG PO TABS: 25.0000 mg | ORAL_TABLET | Freq: Three times a day (TID) | ORAL | 0 refills | Status: DC | PRN

## 2016-10-27 MED ORDER — MECLIZINE HCL 25 MG PO TABS
50.0000 mg | ORAL_TABLET | Freq: Once | ORAL | Status: AC
  Administered 2016-10-27: 50 mg via ORAL
  Filled 2016-10-27: qty 2

## 2016-10-27 MED ORDER — ONDANSETRON 4 MG PO TBDP: 4.0000 mg | ORAL_TABLET | Freq: Four times a day (QID) | ORAL | 0 refills | Status: DC | PRN

## 2016-10-27 NOTE — ED Provider Notes (Signed)
TIME SEEN: 1:38 AM   By signing my name below, I, Cynda Acres, attest that this documentation has been prepared under the direction and in the presence of Aniah Pauli, Layla Maw, DO. Electronically Signed: Cynda Acres, Scribe. 10/27/16. 1:41 AM.   CHIEF COMPLAINT: Dizziness   HPI:  Ronald Greer is a 41 y.o. male with a history of DM2, HTN, HLD, and polysubstance abuse, who presents to the Emergency Department complaining of sudden-onset dizziness that began yesterday. Patient describes his dizziness as feeling like the room is spinning. Patient states his dizziness is worse with left neck rotation. Patient did not drive himself here tonight. Patient denies any hearing loss or ear ringing. He denies any ear pain. Patient states he was recently started on new blood pressure medication one month ago and was not sure if this was causing his symptoms. He denies any lightheadedness, dizziness with standing. Patient reports associated nausea and a "cloudy' feeling. No modifying factors indicated. Patient does have a history of IV drug use, but he has been clean for 1 1/2 years. Patient drinks 2 glasses of wine daily. Patient denies any numbness, tingling, weakness, chest pain, shortness of breath, or any additional symptoms.    ROS: See HPI Constitutional: no fever  Eyes: no drainage  ENT: no runny nose   Cardiovascular:  no chest pain  Resp: no SOB  GI: no vomiting GU: no dysuria Integumentary: no rash  Allergy: no hives  Musculoskeletal: no leg swelling  Neurological: no slurred speech ROS otherwise negative  PAST MEDICAL HISTORY/PAST SURGICAL HISTORY:  Past Medical History:  Diagnosis Date  . Acute encephalopathy 07/20/2011   hospitalization with ARF sepsis and vent dependent resp failure  . Anxiety attack   . Diabetes type 2, controlled (HCC)   . Essential hypertension   . GAD (generalized anxiety disorder)   . History of alcohol abuse   . History of drug abuse    IV, x1 heroine  . HLD  (hyperlipidemia)     MEDICATIONS:  Prior to Admission medications   Medication Sig Start Date End Date Taking? Authorizing Provider  busPIRone (BUSPAR) 15 MG tablet Take 1 tablet (15 mg total) by mouth 3 (three) times daily. 10/29/14   Eustaquio Boyden, MD  hydrOXYzine (ATARAX/VISTARIL) 50 MG tablet Take 1 tablet (50 mg total) by mouth at bedtime as needed (sleep). 10/29/14   Eustaquio Boyden, MD  metoprolol tartrate (LOPRESSOR) 25 MG tablet Take 25 mg by mouth 2 (two) times daily.    [provider]  nabumetone (RELAFEN) 500 MG tablet Take 1 tablet (500 mg total) by mouth daily. 10/29/14   Eustaquio Boyden, MD  SitaGLIPtin-MetFORMIN HCl [phone] MG TB24 Take 1 tablet by mouth daily.    [provider]    ALLERGIES:  Allergies  Allergen Reactions  . Avelox [Moxifloxacin Hcl In Nacl] Anaphylaxis  . Amitriptyline Other (See Comments)    Restless legs    SOCIAL HISTORY:  Social History  Substance Use Topics  . Smoking status: Never Smoker  . Smokeless tobacco: Current User    Types: Snuff  . Alcohol use 0.0 oz/week     Comment: every day    FAMILY HISTORY: Family History  Problem Relation Age of Onset  . Hyperlipidemia Mother   . Hypertension Mother   . Cancer Mother        breast  . Diabetes Mother   . CAD Maternal Grandfather        MI  . Cancer Father  bone    EXAM: BP 132/85 (BP Location: Left Arm)   Pulse (!) 101   Temp 98.5 F (36.9 C) (Oral)   Resp 18   Ht 6' (1.829 m)   Wt 250 lb (113.4 kg)   SpO2 96%   BMI 33.91 kg/m  CONSTITUTIONAL: Alert and oriented and responds appropriately to questions. Well-appearing; well-nourished HEAD: Normocephalic EYES: Conjunctivae clear, pupils appear equal, EOMI ENT: normal nose; moist mucous membranes; TMs are clear bilaterally without erythema, purulence, bulging, perforation, effusion.  No cerumen impaction or sign of foreign body in the external auditory canal. No inflammation, erythema or  drainage from the external auditory canal. No signs of mastoiditis. No pain with manipulation of the pinna bilaterally.  No pharyngeal erythema or petechiae, no tonsillar hypertrophy or exudate, no uvular deviation, no unilateral swelling, no trismus or drooling, no muffled voice, normal phonation, no stridor, no dental caries present, no drainable dental abscess noted, no Ludwig's angina, tongue sits flat in the bottom of the mouth, no angioedema, no facial erythema or warmth, no facial swelling; no pain with movement of the neck. NECK: Supple, no meningismus, no nuchal rigidity, no LAD  CARD: RRR; S1 and S2 appreciated; no murmurs, no clicks, no rubs, no gallops RESP: Normal chest excursion without splinting or tachypnea; breath sounds clear and equal bilaterally; no wheezes, no rhonchi, no rales, no hypoxia or respiratory distress, speaking full sentences ABD/GI: Normal bowel sounds; non-distended; soft, non-tender, no rebound, no guarding, no peritoneal signs, no hepatosplenomegaly BACK:  The back appears normal and is non-tender to palpation, there is no CVA tenderness EXT: Normal ROM in all joints; non-tender to palpation; no edema; normal capillary refill; no cyanosis, no calf tenderness or swelling    SKIN: Normal color for age and race; warm; no rash NEURO: Moves all extremities equally; Strength 5/5 in all four extremities.  Normal sensation diffusely.  CN 2-12 grossly intact.  No dysmetria to finger to nose testing bilaterally.  Normal speech.  Normal gait. PSYCH: The patient's mood and manner are appropriate. Grooming and personal hygiene are appropriate.  MEDICAL DECISION MAKING: Patient here with symptoms of vertigo. Suspect that this is peripheral in nature. Stroke order set was ordered in triage and has been unremarkable other than patient having elevated AST and ALT. He states that he only drinks 2 glasses of wine a night but when we discussed this further he states that he does drink  more. I suspect that this is the cause of his elevated liver function tests. His abdominal exam is benign. I have recommended that he stop drinking alcohol. He does not take Tylenol at all. No known history of hepatitis. Have discussed he should follow-up with her primary care physician for monitoring of his elevated AST and ALT. CT of the head is unremarkable. Will obtain urinalysis, urine drug screen and give meclizine, Zofran for symptomatic relief. One cartridge oral fluids. I do not think this is a stroke. I do not think he needs an MRI of his brain. Symptoms present when he turns his head to the left.  ED PROGRESS: Patient remains neurologically intact. Hemodynamically stable. Urine shows no sign of infection in urine drug screen is negative. He reports feeling better after meclizine and Zofran is able to ambulate without any assistance or difficulty. He has been able to drink as well. I feel he is safe to be discharged with prescriptions of meclizine, Zofran. He has outpatient PCP for follow-up. Have again advised him to avoid Tylenol, alcohol  and have his liver function tests monitored as an outpatient. We'll give ENT follow-up as well as vertigo does not improve.  At this time, I do not feel there is any life-threatening condition present. I have reviewed and discussed all results (EKG, imaging, lab, urine as appropriate) and exam findings with patient/family. I have reviewed nursing notes and appropriate previous records.  I feel the patient is safe to be discharged home without further emergent workup and can continue workup as an outpatient as needed. Discussed usual and customary return precautions. Patient/family verbalize understanding and are comfortable with this plan.  Outpatient follow-up has been provided if needed. All questions have been answered.    EKG Interpretation  Date/Time:  Wednesday October 26 2016 22:53:25 EDT Ventricular Rate:  95 PR Interval:  156 QRS Duration: 82 QT  Interval:  328 QTC Calculation: 412 R Axis:   115 Text Interpretation:  Normal sinus rhythm Left posterior fascicular block Cannot rule out Anterior infarct , age undetermined T wave abnormality, consider inferior ischemia Abnormal ECG No significant change from last EKG other than rate is slower Confirmed by Rochele Raring [phone]) on 10/27/2016 1:48:19 AM       I personally performed the services described in this documentation, which was scribed in my presence. The recorded information has been reviewed and is accurate.     Chany Woolworth, Layla Maw, DO 10/27/16 [phone]

## 2016-11-25 MED ORDER — FENTANYL CITRATE (PF) 100 MCG/2ML IJ SOLN
INTRAMUSCULAR | Status: AC
  Filled 2016-11-25: qty 2

## 2016-11-25 MED ORDER — LORAZEPAM 2 MG/ML IJ SOLN
INTRAMUSCULAR | Status: AC
  Filled 2016-11-25: qty 1

## 2016-11-25 MED ORDER — MIDAZOLAM HCL 2 MG/2ML IJ SOLN
INTRAMUSCULAR | Status: AC
  Filled 2016-11-25: qty 6

## 2017-03-22 ENCOUNTER — Encounter (HOSPITAL_BASED_OUTPATIENT_CLINIC_OR_DEPARTMENT_OTHER): Payer: Self-pay | Admitting: Emergency Medicine

## 2017-03-22 ENCOUNTER — Other Ambulatory Visit: Payer: Self-pay

## 2017-03-22 ENCOUNTER — Emergency Department (HOSPITAL_BASED_OUTPATIENT_CLINIC_OR_DEPARTMENT_OTHER): Payer: PRIVATE HEALTH INSURANCE

## 2017-03-22 ENCOUNTER — Emergency Department (HOSPITAL_BASED_OUTPATIENT_CLINIC_OR_DEPARTMENT_OTHER)
Admission: EM | Admit: 2017-03-22 | Discharge: 2017-03-22 | Disposition: A | Discharge status: Home / Self Care | Payer: PRIVATE HEALTH INSURANCE | Attending: Emergency Medicine | Admitting: Emergency Medicine

## 2017-03-22 DIAGNOSIS — Z79899 Other long term (current) drug therapy: Secondary | ICD-10-CM | POA: Diagnosis not present

## 2017-03-22 DIAGNOSIS — I160 Hypertensive urgency: Secondary | ICD-10-CM | POA: Diagnosis not present

## 2017-03-22 DIAGNOSIS — E119 Type 2 diabetes mellitus without complications: Secondary | ICD-10-CM | POA: Diagnosis not present

## 2017-03-22 DIAGNOSIS — F17220 Nicotine dependence, chewing tobacco, uncomplicated: Secondary | ICD-10-CM | POA: Insufficient documentation

## 2017-03-22 DIAGNOSIS — R42 Dizziness and giddiness: Secondary | ICD-10-CM | POA: Diagnosis present

## 2017-03-22 LAB — CBC WITH DIFFERENTIAL/PLATELET
BASOS PCT: 1 %
Basophils Absolute: 0.1 10*3/uL (ref 0.0–0.1)
EOS PCT: 1 %
Eosinophils Absolute: 0.1 10*3/uL (ref 0.0–0.7)
HCT: 46 % (ref 39.0–52.0)
Hemoglobin: 15.6 g/dL (ref 13.0–17.0)
LYMPHS ABS: 1.5 10*3/uL (ref 0.7–4.0)
Lymphocytes Relative: 24 %
MCH: 32.6 pg (ref 26.0–34.0)
MCHC: 33.9 g/dL (ref 30.0–36.0)
MCV: 96.2 fL (ref 78.0–100.0)
MONOS PCT: 8 %
Monocytes Absolute: 0.5 10*3/uL (ref 0.1–1.0)
Neutro Abs: 4.2 10*3/uL (ref 1.7–7.7)
Neutrophils Relative %: 66 %
PLATELETS: 216 10*3/uL (ref 150–400)
RBC: 4.78 MIL/uL (ref 4.22–5.81)
RDW: 12.9 % (ref 11.5–15.5)
WBC: 6.3 10*3/uL (ref 4.0–10.5)

## 2017-03-22 LAB — BASIC METABOLIC PANEL
Anion gap: 9 (ref 5–15)
BUN: 14 mg/dL (ref 6–20)
CALCIUM: 9.3 mg/dL (ref 8.9–10.3)
CHLORIDE: 99 mmol/L — AB (ref 101–111)
CO2: 27 mmol/L (ref 22–32)
CREATININE: 0.73 mg/dL (ref 0.61–1.24)
GFR calc Af Amer: >60 mL/min (ref >60)
GFR calc non Af Amer: >60 mL/min (ref >60)
Glucose, Bld: 198 mg/dL — ABNORMAL HIGH (ref 65–99)
Potassium: 4 mmol/L (ref 3.5–5.1)
SODIUM: 135 mmol/L (ref 135–145)

## 2017-03-22 LAB — CBG MONITORING, ED: Glucose-Capillary: 233 mg/dL — ABNORMAL HIGH (ref 65–99)

## 2017-03-22 NOTE — ED Triage Notes (Addendum)
Pt states he woke up dizzy. Attended a wellness fair at work and found to be hypertensive. Pt reports he noticed pain to his L arm and numbness to L ring and pinky finger while enroute here. Nurse at work told him to come to the ED due to high BP. Pt reports he has been out of metformin and BP meds for 1 month.

## 2017-03-22 NOTE — Discharge Instructions (Signed)
Follow-up with your doctor as scheduled on Friday to discuss your medications.  Return to the emergency department if you develop severe headache, weakness, visual changes, or other new and concerning symptoms.

## 2017-03-22 NOTE — ED Notes (Signed)
ED Provider at bedside. 

## 2017-03-22 NOTE — ED Notes (Signed)
Pt on monitor 

## 2017-03-22 NOTE — ED Provider Notes (Signed)
[facility] EMERGENCY DEPARTMENT Provider Note   CSN: [id number] Arrival date & time: 03/22/17  1627     History   Chief Complaint Chief Complaint  Patient presents with  . Dizziness    HPI Ronald Greer is a 41 y.o. male.  Patient is a 41 year old male with past medical history of diabetes, hypertension presenting for evaluation of elevated blood pressure and left arm numbness.  This started approximately 2 hours ago.  He woke this morning to go to work and had a headache throughout the day.  He had a wellness clinic at work during which time his blood pressure was measured and was found to be over 200 systolic.  He was told by the individual who was sponsoring the clinic to come to the ER to be evaluated.  While he was on his way here, he developed numbness to his fourth and fifth finger of his left arm that extended into the forearm.  He also reports pain in his shoulder that started on the way here.  He has a history of rotator cuff repair and believes that this is the cause.  His pain and numbness is worse with movement of the shoulder.  He denies any visual disturbances.  He denies any numbness of his face or arm.  He stopped taking his ACE inhibitor approximately 1 month ago secondary to cough and also stopped taking his metformin due to making him "feel like a zombie".   The history is provided by the patient.  Hypertension  This is a chronic problem. The current episode started 1 to 2 hours ago. The problem occurs constantly. The problem has not changed since onset.Associated symptoms include headaches. Pertinent negatives include no chest pain. Nothing aggravates the symptoms. The symptoms are relieved by medications. He has tried nothing for the symptoms.    Past Medical History:  Diagnosis Date  . Acute encephalopathy 07/20/2011   hospitalization with ARF sepsis and vent dependent resp failure  . Anxiety attack   . Diabetes type 2, controlled (HCC)   .  Essential hypertension   . GAD (generalized anxiety disorder)   . History of alcohol abuse   . History of drug abuse    IV, x1 heroine  . HLD (hyperlipidemia)     Patient Active Problem List   Diagnosis Date Noted  . Cervical disc disorder with radiculopathy of cervical region 10/08/2014  . Chronic pain syndrome 10/08/2014  . Essential hypertension   . HLD (hyperlipidemia)   . Diabetes type 2, controlled (HCC)   . GAD (generalized anxiety disorder)   . History of drug abuse   . History of alcohol abuse     Past Surgical History:  Procedure Laterality Date  . ANKLE SURGERY Right    x 2  . ARTHROSCOPIC REPAIR ACL Right   . ROTATOR CUFF REPAIR Left   . SHOULDER ARTHROSCOPY Left    x 2       Home Medications    Prior to Admission medications   Medication Sig Start Date End Date Taking? Authorizing Provider  busPIRone (BUSPAR) 15 MG tablet Take 1 tablet (15 mg total) by mouth 3 (three) times daily. 10/29/14   Eustaquio BoydenGutierrez, Javier, MD  hydrOXYzine (ATARAX/VISTARIL) 50 MG tablet Take 1 tablet (50 mg total) by mouth at bedtime as needed (sleep). 10/29/14   Eustaquio BoydenGutierrez, Javier, MD  meclizine (ANTIVERT) 25 MG tablet Take 1 tablet (25 mg total) by mouth 3 (three) times daily as needed for dizziness. 10/27/16  Ward, Layla Maw, DO  metoprolol tartrate (LOPRESSOR) 25 MG tablet Take 25 mg by mouth 2 (two) times daily.    [provider]  nabumetone (RELAFEN) 500 MG tablet Take 1 tablet (500 mg total) by mouth daily. 10/29/14   Eustaquio Boyden, MD  ondansetron (ZOFRAN ODT) 4 MG disintegrating tablet Take 1 tablet (4 mg total) by mouth every 6 (six) hours as needed for nausea or vomiting. 10/27/16   Ward, Layla Maw, DO  SitaGLIPtin-MetFORMIN HCl [phone] MG TB24 Take 1 tablet by mouth daily.    [provider]    Family History Family History  Problem Relation Age of Onset  . Hyperlipidemia Mother   . Hypertension Mother   . Cancer Mother        breast  . Diabetes  Mother   . CAD Maternal Grandfather        MI  . Cancer Father        bone    Social History Social History   Tobacco Use  . Smoking status: Never Smoker  . Smokeless tobacco: Current User    Types: Snuff  Substance Use Topics  . Alcohol use: Yes    Alcohol/week: 0.0 oz    Comment: every day  . Drug use: No    Comment: previous-heroin OD clean over 1 1/2 yrs     Allergies   Avelox [moxifloxacin hcl in nacl] and Amitriptyline   Review of Systems Review of Systems  Cardiovascular: Negative for chest pain.  Neurological: Positive for headaches.  All other systems reviewed and are negative.    Physical Exam Updated Vital Signs BP (!) 181/94   Pulse (!) 50   Temp 98.6 F (37 C)   Resp (!) 24   Ht 6' (1.829 m)   Wt 108 kg (238 lb)   SpO2 98%   BMI 32.28 kg/m   Physical Exam  Constitutional: He is oriented to person, place, and time. He appears well-developed and well-nourished. No distress.  HENT:  Head: Normocephalic and atraumatic.  Mouth/Throat: Oropharynx is clear and moist.  Eyes: EOM are normal. Pupils are equal, round, and reactive to light.  Neck: Normal range of motion. Neck supple.  Cardiovascular: Normal rate and regular rhythm. Exam reveals no friction rub.  No murmur heard. Pulmonary/Chest: Effort normal and breath sounds normal. No respiratory distress. He has no wheezes. He has no rales.  Abdominal: Soft. Bowel sounds are normal. He exhibits no distension. There is no tenderness.  Musculoskeletal: Normal range of motion. He exhibits no edema.  Neurological: He is alert and oriented to person, place, and time. No cranial nerve deficit. He exhibits normal muscle tone. Coordination normal.  Skin: Skin is warm and dry. He is not diaphoretic.  Nursing note and vitals reviewed.    ED Treatments / Results  Labs (all labs ordered are listed, but only abnormal results are displayed) Labs Reviewed  CBG MONITORING, ED - Abnormal; Notable for the  following components:      Result Value   Glucose-Capillary 233 (*)    All other components within normal limits  BASIC METABOLIC PANEL  CBC WITH DIFFERENTIAL/PLATELET    EKG  EKG Interpretation None       Radiology No results found.  Procedures Procedures (including critical care time)  Medications Ordered in ED Medications - No data to display   Initial Impression / Assessment and Plan / ED Course  I have reviewed the triage vital signs and the nursing notes.  Pertinent labs &  imaging results that were available during my care of the patient were reviewed by me and considered in my medical decision making (see chart for details).  Patient presents here for evaluation of elevated blood pressure and dizziness that began at work.  He has been off his antihypertensive for the past month.  His neurologic exam is nonfocal and his blood pressure has gradually come down since his presentation.  His blood pressure is now 145/89 and I believe appropriate for discharge.  While in route to the hospital, he states he developed some numbness in his fourth and fifth finger as well as pain in his left shoulder.  This was worse with movement.  He has a history of rotator cuff surgery and states this feels like what he experienced with his rotator cuff pain.  I highly doubt acute CVA.  His head CT was negative and his neurologic exam is nonfocal.  Final Clinical Impressions(s) / ED Diagnoses   Final diagnoses:  None    ED Discharge Orders    None       Geoffery Lyonselo, Szymon Foiles, MD 03/22/17 2016

## 2017-03-27 ENCOUNTER — Encounter (HOSPITAL_BASED_OUTPATIENT_CLINIC_OR_DEPARTMENT_OTHER): Payer: Self-pay | Admitting: *Deleted

## 2017-03-27 ENCOUNTER — Emergency Department (HOSPITAL_BASED_OUTPATIENT_CLINIC_OR_DEPARTMENT_OTHER): Payer: PRIVATE HEALTH INSURANCE

## 2017-03-27 ENCOUNTER — Emergency Department (HOSPITAL_BASED_OUTPATIENT_CLINIC_OR_DEPARTMENT_OTHER)
Admission: EM | Admit: 2017-03-27 | Discharge: 2017-03-27 | Disposition: A | Discharge status: Home / Self Care | Payer: PRIVATE HEALTH INSURANCE | Attending: Emergency Medicine | Admitting: Emergency Medicine

## 2017-03-27 ENCOUNTER — Other Ambulatory Visit: Payer: Self-pay

## 2017-03-27 DIAGNOSIS — I10 Essential (primary) hypertension: Secondary | ICD-10-CM | POA: Insufficient documentation

## 2017-03-27 DIAGNOSIS — R945 Abnormal results of liver function studies: Secondary | ICD-10-CM | POA: Diagnosis not present

## 2017-03-27 DIAGNOSIS — R0789 Other chest pain: Secondary | ICD-10-CM

## 2017-03-27 DIAGNOSIS — F17290 Nicotine dependence, other tobacco product, uncomplicated: Secondary | ICD-10-CM | POA: Diagnosis not present

## 2017-03-27 DIAGNOSIS — E119 Type 2 diabetes mellitus without complications: Secondary | ICD-10-CM | POA: Diagnosis not present

## 2017-03-27 DIAGNOSIS — R739 Hyperglycemia, unspecified: Secondary | ICD-10-CM | POA: Insufficient documentation

## 2017-03-27 DIAGNOSIS — R079 Chest pain, unspecified: Secondary | ICD-10-CM | POA: Diagnosis present

## 2017-03-27 DIAGNOSIS — R7989 Other specified abnormal findings of blood chemistry: Secondary | ICD-10-CM

## 2017-03-27 DIAGNOSIS — Z7982 Long term (current) use of aspirin: Secondary | ICD-10-CM | POA: Diagnosis not present

## 2017-03-27 DIAGNOSIS — Z79899 Other long term (current) drug therapy: Secondary | ICD-10-CM | POA: Diagnosis not present

## 2017-03-27 LAB — CBC WITH DIFFERENTIAL/PLATELET
BASOS ABS: 0.1 10*3/uL (ref 0.0–0.1)
BASOS PCT: 1 %
EOS PCT: 1 %
Eosinophils Absolute: 0.1 10*3/uL (ref 0.0–0.7)
HCT: 46.7 % (ref 39.0–52.0)
Hemoglobin: 16 g/dL (ref 13.0–17.0)
LYMPHS PCT: 21 %
Lymphs Abs: 1.7 10*3/uL (ref 0.7–4.0)
MCH: 32.7 pg (ref 26.0–34.0)
MCHC: 34.3 g/dL (ref 30.0–36.0)
MCV: 95.5 fL (ref 78.0–100.0)
Monocytes Absolute: 0.6 10*3/uL (ref 0.1–1.0)
Monocytes Relative: 7 %
Neutro Abs: 5.6 10*3/uL (ref 1.7–7.7)
Neutrophils Relative %: 70 %
PLATELETS: 221 10*3/uL (ref 150–400)
RBC: 4.89 MIL/uL (ref 4.22–5.81)
RDW: 12.7 % (ref 11.5–15.5)
WBC: 8 10*3/uL (ref 4.0–10.5)

## 2017-03-27 LAB — HEPATIC FUNCTION PANEL
ALBUMIN: 4.4 g/dL (ref 3.5–5.0)
ALT: 180 U/L — AB (ref 17–63)
AST: 147 U/L — AB (ref 15–41)
Alkaline Phosphatase: 120 U/L (ref 38–126)
BILIRUBIN DIRECT: 0.1 mg/dL (ref 0.1–0.5)
Indirect Bilirubin: 0.5 mg/dL (ref 0.3–0.9)
TOTAL PROTEIN: 8.2 g/dL — AB (ref 6.5–8.1)
Total Bilirubin: 0.6 mg/dL (ref 0.3–1.2)

## 2017-03-27 LAB — BASIC METABOLIC PANEL
ANION GAP: 12 (ref 5–15)
BUN: 9 mg/dL (ref 6–20)
CALCIUM: 9.1 mg/dL (ref 8.9–10.3)
CO2: 24 mmol/L (ref 22–32)
Chloride: 99 mmol/L — ABNORMAL LOW (ref 101–111)
Creatinine, Ser: 0.6 mg/dL — ABNORMAL LOW (ref 0.61–1.24)
Glucose, Bld: 250 mg/dL — ABNORMAL HIGH (ref 65–99)
Potassium: 4 mmol/L (ref 3.5–5.1)
SODIUM: 135 mmol/L (ref 135–145)

## 2017-03-27 LAB — LIPASE, BLOOD: Lipase: 33 U/L (ref 11–51)

## 2017-03-27 LAB — TROPONIN I

## 2017-03-27 NOTE — ED Notes (Signed)
Pt on monitor 

## 2017-03-27 NOTE — ED Triage Notes (Signed)
Pt c/o left sided chest pain. Left arm numbness  X 2 days also c/o n/v/d x 1 day.

## 2017-03-27 NOTE — ED Notes (Signed)
States needs a noted for work today and that's why he can in. Denies n/v or chest pain at present. States," I feel fine"

## 2017-03-27 NOTE — ED Provider Notes (Signed)
[facility] EMERGENCY DEPARTMENT Provider Note   CSN: [id number] Arrival date & time: 03/27/17  1347  History   Chief Complaint Chief Complaint  Patient presents with  . Chest Pain    HPI Ronald Greer is a 41 y.o. male.  HPI   Pt has a PMH of hypertension, diabetes, alcohol abuse comes to the ER for evaluation of chest pain. He was seen on 11/14 for severely high BP. This morning he woke up and had severe left sided chest pain with a few episodes of vomiting yellow thin fluid. It resolved on its own. He missed work because of this therefore he came to the ER for a work note. He has not had any further episodes of chest pain or vomiting. He is taking his new medication and his BP is 135/76 today. He also admits to being a daily drinker.  Past Medical History:  Diagnosis Date  . Acute encephalopathy 07/20/2011   hospitalization with ARF sepsis and vent dependent resp failure  . Anxiety attack   . Diabetes type 2, controlled (HCC)   . Essential hypertension   . GAD (generalized anxiety disorder)   . History of alcohol abuse   . History of drug abuse    IV, x1 heroine  . HLD (hyperlipidemia)     Patient Active Problem List   Diagnosis Date Noted  . Cervical disc disorder with radiculopathy of cervical region 10/08/2014  . Chronic pain syndrome 10/08/2014  . Essential hypertension   . HLD (hyperlipidemia)   . Diabetes type 2, controlled (HCC)   . GAD (generalized anxiety disorder)   . History of drug abuse   . History of alcohol abuse     Past Surgical History:  Procedure Laterality Date  . ANKLE SURGERY Right    x 2  . ARTHROSCOPIC REPAIR ACL Right   . ROTATOR CUFF REPAIR Left   . SHOULDER ARTHROSCOPY Left    x 2       Home Medications    Prior to Admission medications   Medication Sig Start Date End Date Taking? Authorizing Provider  aspirin 81 MG chewable tablet Chew daily by mouth.   Yes [provider]  dapagliflozin propanediol  (FARXIGA) 5 MG TABS tablet Take 5 mg daily by mouth.   Yes [provider]  metoprolol succinate (TOPROL-XL) 50 MG 24 hr tablet Take 50 mg daily by mouth. Take with or immediately following a meal.   Yes [provider]  simvastatin (ZOCOR) 40 MG tablet Take 40 mg daily by mouth.   Yes [provider]  busPIRone (BUSPAR) 15 MG tablet Take 1 tablet (15 mg total) by mouth 3 (three) times daily. Patient taking differently: Take 7.5 mg 3 (three) times daily by mouth.  10/29/14   Eustaquio Boyden, MD  metoprolol tartrate (LOPRESSOR) 25 MG tablet Take 25 mg by mouth 2 (two) times daily.    [provider]    Family History Family History  Problem Relation Age of Onset  . Hyperlipidemia Mother   . Hypertension Mother   . Cancer Mother        breast  . Diabetes Mother   . CAD Maternal Grandfather        MI  . Cancer Father        bone    Social History Social History   Tobacco Use  . Smoking status: Never Smoker  . Smokeless tobacco: Current User    Types: Snuff  Substance Use Topics  .  Alcohol use: Yes    Alcohol/week: 0.0 oz    Comment: every day  . Drug use: No    Comment: previous-heroin OD clean over 1 1/2 yrs     Allergies   Avelox [moxifloxacin hcl in nacl] and Amitriptyline   Review of Systems Review of Systems The patient denies anorexia, fever, weight loss,, vision loss, decreased hearing, hoarseness, syncope, dyspnea on exertion, peripheral edema, balance deficits, hemoptysis, abdominal pain, melena, hematochezia, severe indigestion/heartburn, hematuria, incontinence, genital sores, muscle weakness, suspicious skin lesions, transient blindness, difficulty walking, depression, unusual weight change, abnormal bleeding, enlarged lymph nodes, angioedema, and breast masses.   Physical Exam Updated Vital Signs BP 135/76   Pulse 83   Temp 98.8 F (37.1 C) (Oral)   Resp 20   Ht 6' (1.829 m)   Wt 108 kg (238 lb)   SpO2 92%   BMI  32.28 kg/m   Physical Exam  Constitutional: He is oriented to person, place, and time. He appears well-developed and well-nourished.  HENT:  Head: Normocephalic and atraumatic.  Eyes: EOM are normal. Pupils are equal, round, and reactive to light.  Neck: Normal range of motion.  Cardiovascular: Normal rate and regular rhythm.  Pulmonary/Chest: Effort normal and breath sounds normal.  Musculoskeletal: Normal range of motion.  Neurological: He is alert and oriented to person, place, and time.  Skin: Skin is warm and dry.     ED Treatments / Results  Labs (all labs ordered are listed, but only abnormal results are displayed) Labs Reviewed  BASIC METABOLIC PANEL - Abnormal; Notable for the following components:      Result Value   Chloride 99 (*)    Glucose, Bld 250 (*)    Creatinine, Ser 0.60 (*)    All other components within normal limits  HEPATIC FUNCTION PANEL - Abnormal; Notable for the following components:   Total Protein 8.2 (*)    AST 147 (*)    ALT 180 (*)    All other components within normal limits  CBC WITH DIFFERENTIAL/PLATELET  TROPONIN I  LIPASE, BLOOD    EKG  EKG Interpretation  Date/Time:  Monday March 27 2017 13:55:56 EST Ventricular Rate:  85 PR Interval:  164 QRS Duration: 94 QT Interval:  364 QTC Calculation: 433 R Axis:   78 Text Interpretation:  Normal sinus rhythm Normal ECG Confirmed by Margarita Grizzle [phone]) on 03/27/2017 2:34:24 PM       Radiology Dg Chest 2 View  Result Date: 03/27/2017 CLINICAL DATA:  Left side chest pain, hypertension EXAM: CHEST  2 VIEW COMPARISON:  07/22/2011 FINDINGS: Heart and mediastinal contours are within normal limits. No focal opacities or effusions. No acute bony abnormality. IMPRESSION: No active cardiopulmonary disease. Electronically Signed   By: Charlett Nose M.D.   On: 03/27/2017 15:27    Procedures Procedures (including critical care time)  Medications Ordered in ED Medications - No data to  display   Initial Impression / Assessment and Plan / ED Course  I have reviewed the triage vital signs and the nursing notes.  Pertinent labs & imaging results that were available during my care of the patient were reviewed by me and considered in my medical decision making (see chart for details).   EKG unremarkable, BP much improved from a few days ago. Chest xray and Troponin (negative) are very reassuring. AST/ALT and CBG are elevated.   He has been advised to control his diet better and drink less alcohol. He will need close follow-up with  his PCP.   Blood pressure 135/76, pulse 83, temperature 98.8 F (37.1 C), temperature source Oral, resp. rate 20, height 6' (1.829 m), weight 108 kg (238 lb), SpO2 92 %.  Ronald Greer has been evaluated today in the emergency department. The appropriate screening and testing was been performed and I believe the patient to be medically stable for discharge.  Return signs and symptoms have been discussed with the patient and/or caregivers and they have voiced their understanding. The patient has agreed to follow-up with their primary care provider or the referred specialist.  Final Clinical Impressions(s) / ED Diagnoses   Final diagnoses:  Atypical chest pain  Hyperglycemia  Elevated liver function tests    ED Discharge Orders    None       Marlon PelGreene, Shyra Emile, PA-C 03/27/17 1615    Nira Connardama, Pedro Eduardo, MD 03/29/17 [phone]

## 2017-07-24 ENCOUNTER — Encounter: Payer: Self-pay | Admitting: Family Medicine

## 2017-07-24 ENCOUNTER — Other Ambulatory Visit: Payer: Self-pay

## 2017-07-24 ENCOUNTER — Ambulatory Visit: Payer: 59 | Admitting: Family Medicine

## 2017-07-24 VITALS — BP 124/62 | HR 92 | Temp 98.7°F | Ht 73.5 in | Wt 239.8 lb

## 2017-07-24 DIAGNOSIS — R062 Wheezing: Secondary | ICD-10-CM

## 2017-07-24 DIAGNOSIS — L02214 Cutaneous abscess of groin: Secondary | ICD-10-CM | POA: Diagnosis not present

## 2017-07-24 DIAGNOSIS — J069 Acute upper respiratory infection, unspecified: Secondary | ICD-10-CM

## 2017-07-24 MED ORDER — ALBUTEROL SULFATE HFA 108 (90 BASE) MCG/ACT IN AERS
2.0000 | INHALATION_SPRAY | Freq: Four times a day (QID) | RESPIRATORY_TRACT | 0 refills | Status: AC | PRN
Start: 2017-07-24 — End: ?

## 2017-07-24 MED ORDER — ALBUTEROL SULFATE (2.5 MG/3ML) 0.083% IN NEBU
2.5000 mg | INHALATION_SOLUTION | Freq: Once | RESPIRATORY_TRACT | Status: AC
  Administered 2017-07-24: 2.5 mg via RESPIRATORY_TRACT

## 2017-07-24 MED ORDER — DOXYCYCLINE HYCLATE 100 MG PO TABS
100.0000 mg | ORAL_TABLET | Freq: Two times a day (BID) | ORAL | 0 refills | Status: DC
Start: 2017-07-24 — End: 2019-01-29

## 2017-07-24 NOTE — Progress Notes (Signed)
3/18/201911:42 AM  Ronald Greer 05/11/1975, 42 y.o. male [id number]  Chief Complaint  Patient presents with  . Shortness of Breath    having SOB ad wheezing. Also has a boil on the inside left thigh that is very painful, with drainage.    HPI:   Patient is a 42 y.o. male with past medical history significant for DM2 among others who presents today with 2 concerns  1. SOB, wheezing, chest congestion, sore throat, nasal congestion for past several days. Last night was the worse. No fever or chills. No h/o asthma, but states that has recurring bronchitis and pneumonia. Does not smoke. No seasonal allergies but works in an dust heavy environment.   2. Boil in left groin, started several days ago, has been doing sitz baths with epsom salts. Started draining yesterday, has drained a lot and today significantly decreased.   [facility] 07/24/2017  Decreased Interest 0  Down, Depressed, Hopeless 0  PHQ - 2 Score 0    Allergies  Allergen Reactions  . Avelox [Moxifloxacin Hcl In Nacl] Anaphylaxis  . Amitriptyline Other (See Comments)    Restless legs    Prior to Admission medications   Medication Sig Start Date End Date Taking? Authorizing Provider  metoprolol tartrate (LOPRESSOR) 25 MG tablet Take 25 mg by mouth 2 (two) times daily.   Yes [provider]  aspirin 81 MG chewable tablet Chew daily by mouth.    [provider]  busPIRone (BUSPAR) 15 MG tablet Take 1 tablet (15 mg total) by mouth 3 (three) times daily. Patient not taking: Reported on 07/24/2017 10/29/14   Eustaquio BoydenGutierrez, Javier, MD  dapagliflozin propanediol (FARXIGA) 5 MG TABS tablet Take 5 mg daily by mouth.    [provider]  metoprolol succinate (TOPROL-XL) 50 MG 24 hr tablet Take 50 mg daily by mouth. Take with or immediately following a meal.    [provider]  simvastatin (ZOCOR) 40 MG tablet Take 40 mg daily by mouth.    [provider]    Past Medical  History:  Diagnosis Date  . Acute encephalopathy 07/20/2011   hospitalization with ARF sepsis and vent dependent resp failure  . Anxiety attack   . Diabetes type 2, controlled (HCC)   . Essential hypertension   . GAD (generalized anxiety disorder)   . History of alcohol abuse   . History of drug abuse    IV, x1 heroine  . HLD (hyperlipidemia)     Past Surgical History:  Procedure Laterality Date  . ANKLE SURGERY Right    x 2  . ARTHROSCOPIC REPAIR ACL Right   . ROTATOR CUFF REPAIR Left   . SHOULDER ARTHROSCOPY Left    x 2    Social History   Tobacco Use  . Smoking status: Never Smoker  . Smokeless tobacco: Current User    Types: Snuff  Substance Use Topics  . Alcohol use: Yes    Alcohol/week: 0.0 oz    Comment: every day    Family History  Problem Relation Age of Onset  . Hyperlipidemia Mother   . Hypertension Mother   . Cancer Mother        breast  . Diabetes Mother   . CAD Maternal Grandfather        MI  . Cancer Father        bone    ROS Per hpi  OBJECTIVE:  Blood pressure 124/62, pulse 92, temperature 98.7 F (37.1 C), temperature source Oral,  height 6' 1.5" (1.867 m), weight 239 lb 12.8 oz (108.8 kg), SpO2 92 %.  Physical Exam  Constitutional: He is oriented to person, place, and time and well-developed, well-nourished, and in no distress.  HENT:  Head: Normocephalic and atraumatic.  Right Ear: Hearing, tympanic membrane, external ear and ear canal normal.  Left Ear: Hearing, tympanic membrane, external ear and ear canal normal.  Mouth/Throat: Posterior oropharyngeal erythema present. No oropharyngeal exudate.  Eyes: Conjunctivae and EOM are normal. Pupils are equal, round, and reactive to light.  Neck: Neck supple.  Cardiovascular: Normal rate and regular rhythm. Exam reveals no gallop and no friction rub.  No murmur heard. Pulmonary/Chest: Effort normal. He has wheezes. He has no rhonchi. He has no rales.  Lymphadenopathy:    He has no  cervical adenopathy.  Neurological: He is alert and oriented to person, place, and time. Gait normal.  Skin: Skin is warm and dry.        ASSESSMENT and PLAN  1. URI with cough and congestion Discussed supportive measures for URI: increase hydration, rest, OTC medications, etc. RTC precautions discussed.  2. Wheezing Did not completely resolve with neb treatment in clinic, discussed albuterol use, consider prednisone/ further evaluation - albuterol (PROVENTIL) (2.5 MG/3ML) 0.083% nebulizer solution 2.5 mg  3. Abscess of groin, left No area palpable for I+D. Started abx r/se/b reviewed. Continue with sitz bath and warm compresses.  Other orders - albuterol (PROVENTIL HFA;VENTOLIN HFA) 108 (90 Base) MCG/ACT inhaler; Inhale 2 puffs into the lungs every 6 (six) hours as needed for wheezing or shortness of breath. - doxycycline (VIBRA-TABS) 100 MG tablet; Take 1 tablet (100 mg total) by mouth 2 (two) times daily.  Return in about 3 days (around 07/27/2017).    Myles Lipps, MD Primary Care at [facility] [address] [place], [place] [address] Ph.  [phone] Fax [phone]

## 2017-07-24 NOTE — Patient Instructions (Signed)
     IF you received an x-ray today, you will receive an invoice from [facility]. Please contact [facility] at [phone] with questions or concerns regarding your invoice.   IF you received labwork today, you will receive an invoice from [facility]. Please contact [facility] at [phone] with questions or concerns regarding your invoice.   Our billing staff will not be able to assist you with questions regarding bills from these companies.  You will be contacted with the lab results as soon as they are available. The fastest way to get your results is to activate your My Chart account. Instructions are located on the last page of this paperwork. If you have not heard from us regarding the results in 2 weeks, please contact this office.     

## 2017-07-26 ENCOUNTER — Encounter: Payer: Self-pay | Admitting: Emergency Medicine

## 2017-07-26 ENCOUNTER — Other Ambulatory Visit: Payer: Self-pay

## 2017-07-26 ENCOUNTER — Ambulatory Visit: Payer: 59 | Admitting: Emergency Medicine

## 2017-07-26 VITALS — BP 164/75 | HR 98 | Temp 98.2°F | Resp 16 | Wt 235.6 lb

## 2017-07-26 DIAGNOSIS — Z8639 Personal history of other endocrine, nutritional and metabolic disease: Secondary | ICD-10-CM

## 2017-07-26 DIAGNOSIS — N481 Balanitis: Secondary | ICD-10-CM | POA: Diagnosis not present

## 2017-07-26 DIAGNOSIS — R05 Cough: Secondary | ICD-10-CM

## 2017-07-26 DIAGNOSIS — L02214 Cutaneous abscess of groin: Secondary | ICD-10-CM | POA: Diagnosis not present

## 2017-07-26 DIAGNOSIS — J22 Unspecified acute lower respiratory infection: Secondary | ICD-10-CM

## 2017-07-26 DIAGNOSIS — R059 Cough, unspecified: Secondary | ICD-10-CM | POA: Insufficient documentation

## 2017-07-26 MED ORDER — FLUCONAZOLE 150 MG PO TABS: 150.0000 mg | ORAL_TABLET | Freq: Once | ORAL | 0 refills | Status: AC

## 2017-07-26 MED ORDER — PROMETHAZINE-CODEINE 6.25-10 MG/5ML PO SYRP: 5.0000 mL | ORAL_SOLUTION | Freq: Every evening | ORAL | 0 refills | Status: DC | PRN

## 2017-07-26 MED ORDER — PREDNISONE 20 MG PO TABS: 40.0000 mg | ORAL_TABLET | Freq: Every day | ORAL | 0 refills | Status: AC

## 2017-07-26 MED ORDER — CLOTRIMAZOLE-BETAMETHASONE 1-0.05 % EX CREA: 1.0000 "application " | TOPICAL_CREAM | Freq: Two times a day (BID) | CUTANEOUS | 0 refills | Status: DC

## 2017-07-26 MED ORDER — AZITHROMYCIN 250 MG PO TABS: ORAL_TABLET | ORAL | 0 refills | Status: DC

## 2017-07-26 NOTE — Assessment & Plan Note (Signed)
Still active but better according to patient.  Still draining.  Cultured.  We will continue doxycycline and add Zithromax.

## 2017-07-26 NOTE — Patient Instructions (Addendum)
     IF you received an x-ray today, you will receive an invoice from [facility]. Please contact [facility] at [phone] with questions or concerns regarding your invoice.   IF you received labwork today, you will receive an invoice from [facility]. Please contact [facility] at [phone] with questions or concerns regarding your invoice.   Our billing staff will not be able to assist you with questions regarding bills from these companies.  You will be contacted with the lab results as soon as they are available. The fastest way to get your results is to activate your My Chart account. Instructions are located on the last page of this paperwork. If you have not heard from us regarding the results in 2 weeks, please contact this office.     Cough, Adult A cough helps to clear your throat and lungs. A cough may last only 2-3 weeks (acute), or it may last longer than 8 weeks (chronic). Many different things can cause a cough. A cough may be a sign of an illness or another medical condition. Follow these instructions at home:  Pay attention to any changes in your cough.  Take medicines only as told by your doctor. ? If you were prescribed an antibiotic medicine, take it as told by your doctor. Do not stop taking it even if you start to feel better. ? Talk with your doctor before you try using a cough medicine.  Drink enough fluid to keep your pee (urine) clear or pale yellow.  If the air is dry, use a cold steam vaporizer or humidifier in your home.  Stay away from things that make you cough at work or at home.  If your cough is worse at night, try using extra pillows to raise your head up higher while you sleep.  Do not smoke, and try not to be around smoke. If you need help quitting, ask your doctor.  Do not have caffeine.  Do not drink alcohol.  Rest as needed. Contact a doctor if:  You have new problems (symptoms).  You cough up yellow fluid  (pus).  Your cough does not get better after 2-3 weeks, or your cough gets worse.  Medicine does not help your cough and you are not sleeping well.  You have pain that gets worse or pain that is not helped with medicine.  You have a fever.  You are losing weight and you do not know why.  You have night sweats. Get help right away if:  You cough up blood.  You have trouble breathing.  Your heartbeat is very fast. This information is not intended to replace advice given to you by your health care provider. Make sure you discuss any questions you have with your health care provider. Document Released: 01/06/2011 Document Revised: 10/01/2015 Document Reviewed: 07/02/2014 Elsevier Interactive Patient Education  2018 [employer]  

## 2017-07-26 NOTE — Assessment & Plan Note (Signed)
Diabetic.  We will start Diflucan and Lotrisone cream.
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
## 2017-07-26 NOTE — Assessment & Plan Note (Signed)
Improved but still having intermittent wheezing and chest congestion.  Will start prednisone 40 mg a day for 5 days and Zithromax.

## 2017-07-28 LAB — WOUND CULTURE

## 2017-07-31 ENCOUNTER — Encounter: Payer: Self-pay | Admitting: *Deleted

## 2019-01-29 ENCOUNTER — Other Ambulatory Visit: Payer: Self-pay

## 2019-01-29 ENCOUNTER — Emergency Department (HOSPITAL_COMMUNITY): Payer: PRIVATE HEALTH INSURANCE

## 2019-01-29 ENCOUNTER — Emergency Department (HOSPITAL_COMMUNITY)
Admission: EM | Admit: 2019-01-29 | Discharge: 2019-01-29 | Payer: PRIVATE HEALTH INSURANCE | Attending: Emergency Medicine | Admitting: Emergency Medicine

## 2019-01-29 DIAGNOSIS — I10 Essential (primary) hypertension: Secondary | ICD-10-CM | POA: Insufficient documentation

## 2019-01-29 DIAGNOSIS — Y939 Activity, unspecified: Secondary | ICD-10-CM | POA: Insufficient documentation

## 2019-01-29 DIAGNOSIS — S161XXA Strain of muscle, fascia and tendon at neck level, initial encounter: Secondary | ICD-10-CM

## 2019-01-29 DIAGNOSIS — R079 Chest pain, unspecified: Secondary | ICD-10-CM | POA: Insufficient documentation

## 2019-01-29 DIAGNOSIS — Y999 Unspecified external cause status: Secondary | ICD-10-CM | POA: Insufficient documentation

## 2019-01-29 DIAGNOSIS — Y929 Unspecified place or not applicable: Secondary | ICD-10-CM | POA: Insufficient documentation

## 2019-01-29 DIAGNOSIS — S301XXA Contusion of abdominal wall, initial encounter: Secondary | ICD-10-CM

## 2019-01-29 DIAGNOSIS — S0093XA Contusion of unspecified part of head, initial encounter: Secondary | ICD-10-CM

## 2019-01-29 DIAGNOSIS — E119 Type 2 diabetes mellitus without complications: Secondary | ICD-10-CM | POA: Insufficient documentation

## 2019-01-29 DIAGNOSIS — Z7982 Long term (current) use of aspirin: Secondary | ICD-10-CM | POA: Insufficient documentation

## 2019-01-29 DIAGNOSIS — S20219A Contusion of unspecified front wall of thorax, initial encounter: Secondary | ICD-10-CM | POA: Insufficient documentation

## 2019-01-29 LAB — I-STAT CHEM 8, ED
BUN: <3 mg/dL — ABNORMAL LOW (ref 6–20)
Calcium, Ion: 1.09 mmol/L — ABNORMAL LOW (ref 1.15–1.40)
Chloride: 103 mmol/L (ref 98–111)
Creatinine, Ser: 1.1 mg/dL (ref 0.61–1.24)
Glucose, Bld: 341 mg/dL — ABNORMAL HIGH (ref 70–99)
HCT: 48 % (ref 39.0–52.0)
Hemoglobin: 16.3 g/dL (ref 13.0–17.0)
Potassium: 4.5 mmol/L (ref 3.5–5.1)
Sodium: 137 mmol/L (ref 135–145)
TCO2: 19 mmol/L — ABNORMAL LOW (ref 22–32)

## 2019-01-29 LAB — COMPREHENSIVE METABOLIC PANEL
ALT: 243 U/L — ABNORMAL HIGH (ref 0–44)
AST: 380 U/L — ABNORMAL HIGH (ref 15–41)
Albumin: 3.7 g/dL (ref 3.5–5.0)
Alkaline Phosphatase: 172 U/L — ABNORMAL HIGH (ref 38–126)
Anion gap: 16 — ABNORMAL HIGH (ref 5–15)
BUN: <5 mg/dL — ABNORMAL LOW (ref 6–20)
CO2: 19 mmol/L — ABNORMAL LOW (ref 22–32)
Calcium: 9.1 mg/dL (ref 8.9–10.3)
Chloride: 101 mmol/L (ref 98–111)
Creatinine, Ser: 0.73 mg/dL (ref 0.61–1.24)
GFR calc Af Amer: >60 mL/min (ref >60)
GFR calc non Af Amer: >60 mL/min (ref >60)
Glucose, Bld: 344 mg/dL — ABNORMAL HIGH (ref 70–99)
Potassium: 4.6 mmol/L (ref 3.5–5.1)
Sodium: 136 mmol/L (ref 135–145)
Total Bilirubin: 0.5 mg/dL (ref 0.3–1.2)
Total Protein: 6.8 g/dL (ref 6.5–8.1)

## 2019-01-29 LAB — CBC
HCT: 43.5 % (ref 39.0–52.0)
Hemoglobin: 15.3 g/dL (ref 13.0–17.0)
MCH: 33.1 pg (ref 26.0–34.0)
MCHC: 35.2 g/dL (ref 30.0–36.0)
MCV: 94.2 fL (ref 80.0–100.0)
Platelets: 297 10*3/uL (ref 150–400)
RBC: 4.62 MIL/uL (ref 4.22–5.81)
RDW: 13.7 % (ref 11.5–15.5)
WBC: 5.3 10*3/uL (ref 4.0–10.5)
nRBC: 0.8 % — ABNORMAL HIGH (ref 0.0–0.2)

## 2019-01-29 LAB — URINALYSIS, ROUTINE W REFLEX MICROSCOPIC
Bacteria, UA: NONE SEEN
Bilirubin Urine: NEGATIVE
Glucose, UA: >=500 mg/dL — AB
Ketones, ur: 5 mg/dL — AB
Leukocytes,Ua: NEGATIVE
Nitrite: NEGATIVE
Protein, ur: NEGATIVE mg/dL
Specific Gravity, Urine: 1.022 (ref 1.005–1.030)
pH: 5 (ref 5.0–8.0)

## 2019-01-29 LAB — LACTIC ACID, PLASMA: Lactic Acid, Venous: 4.2 mmol/L (ref 0.5–1.9)

## 2019-01-29 LAB — PROTIME-INR
INR: 1 (ref 0.8–1.2)
Prothrombin Time: 13.5 seconds (ref 11.4–15.2)

## 2019-01-29 LAB — SAMPLE TO BLOOD BANK

## 2019-01-29 LAB — ETHANOL: Alcohol, Ethyl (B): 264 mg/dL — ABNORMAL HIGH (ref <10)

## 2019-01-29 LAB — CDS SEROLOGY

## 2019-01-29 MED ORDER — IOHEXOL 300 MG/ML  SOLN
100.0000 mL | Freq: Once | INTRAMUSCULAR | Status: AC | PRN
  Administered 2019-01-29: 20:00:00 100 mL via INTRAVENOUS

## 2019-01-29 NOTE — Discharge Instructions (Addendum)
Ibuprofen 600 mg every 6 hours as needed for pain.  Return to the emergency department if you develop any new and/or concerning symptoms.
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
## 2019-01-29 NOTE — ED Notes (Signed)
Pt verbalized understanding of discharge teaching.
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
## 2019-01-29 NOTE — ED Notes (Signed)
Patient transported to CT 
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
## 2019-01-29 NOTE — ED Notes (Signed)
Legal blood draw obtained, confirmed patients consent. DM [place] received samples.
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
